# Patient Record
Sex: Male | Born: 1950
Health system: Southern US, Community
[De-identification: ages and names within clinical notes are randomized; demographics above are authoritative.]

## PROBLEM LIST (undated history)

## (undated) DIAGNOSIS — E039 Hypothyroidism, unspecified: Secondary | ICD-10-CM

## (undated) DIAGNOSIS — H269 Unspecified cataract: Secondary | ICD-10-CM

## (undated) DIAGNOSIS — R7303 Prediabetes: Secondary | ICD-10-CM

## (undated) DIAGNOSIS — M1990 Unspecified osteoarthritis, unspecified site: Secondary | ICD-10-CM

## (undated) DIAGNOSIS — E7800 Pure hypercholesterolemia, unspecified: Secondary | ICD-10-CM

## (undated) DIAGNOSIS — R519 Headache, unspecified: Secondary | ICD-10-CM

## (undated) DIAGNOSIS — J189 Pneumonia, unspecified organism: Secondary | ICD-10-CM

## (undated) DIAGNOSIS — I8290 Acute embolism and thrombosis of unspecified vein: Secondary | ICD-10-CM

## (undated) DIAGNOSIS — I8391 Asymptomatic varicose veins of right lower extremity: Secondary | ICD-10-CM

## (undated) DIAGNOSIS — Z972 Presence of dental prosthetic device (complete) (partial): Secondary | ICD-10-CM

## (undated) DIAGNOSIS — I8390 Asymptomatic varicose veins of unspecified lower extremity: Secondary | ICD-10-CM

## (undated) DIAGNOSIS — E559 Vitamin D deficiency, unspecified: Secondary | ICD-10-CM

## (undated) HISTORY — DX: Acute embolism and thrombosis of unspecified vein: I82.90

## (undated) HISTORY — PX: MULTIPLE TOOTH EXTRACTIONS: SHX2053

## (undated) HISTORY — PX: ROTATOR CUFF REPAIR: SHX139

---

## 2017-02-15 DIAGNOSIS — M75122 Complete rotator cuff tear or rupture of left shoulder, not specified as traumatic: Secondary | ICD-10-CM | POA: Diagnosis not present

## 2017-02-15 DIAGNOSIS — M7542 Impingement syndrome of left shoulder: Secondary | ICD-10-CM | POA: Diagnosis not present

## 2017-02-19 DIAGNOSIS — M75111 Incomplete rotator cuff tear or rupture of right shoulder, not specified as traumatic: Secondary | ICD-10-CM | POA: Diagnosis not present

## 2017-02-19 DIAGNOSIS — M7521 Bicipital tendinitis, right shoulder: Secondary | ICD-10-CM | POA: Diagnosis not present

## 2017-02-21 DIAGNOSIS — M7521 Bicipital tendinitis, right shoulder: Secondary | ICD-10-CM | POA: Diagnosis not present

## 2017-02-21 DIAGNOSIS — M7551 Bursitis of right shoulder: Secondary | ICD-10-CM | POA: Diagnosis not present

## 2017-02-21 DIAGNOSIS — Z86718 Personal history of other venous thrombosis and embolism: Secondary | ICD-10-CM | POA: Diagnosis not present

## 2017-02-21 DIAGNOSIS — G8918 Other acute postprocedural pain: Secondary | ICD-10-CM | POA: Diagnosis not present

## 2017-02-21 DIAGNOSIS — M7541 Impingement syndrome of right shoulder: Secondary | ICD-10-CM | POA: Diagnosis not present

## 2017-02-21 DIAGNOSIS — M948X1 Other specified disorders of cartilage, shoulder: Secondary | ICD-10-CM | POA: Diagnosis not present

## 2017-02-21 DIAGNOSIS — M75111 Incomplete rotator cuff tear or rupture of right shoulder, not specified as traumatic: Secondary | ICD-10-CM | POA: Diagnosis not present

## 2017-02-21 DIAGNOSIS — M65811 Other synovitis and tenosynovitis, right shoulder: Secondary | ICD-10-CM | POA: Diagnosis not present

## 2017-02-21 DIAGNOSIS — M75121 Complete rotator cuff tear or rupture of right shoulder, not specified as traumatic: Secondary | ICD-10-CM | POA: Diagnosis not present

## 2017-02-21 DIAGNOSIS — M24011 Loose body in right shoulder: Secondary | ICD-10-CM | POA: Diagnosis not present

## 2017-02-21 DIAGNOSIS — Z87891 Personal history of nicotine dependence: Secondary | ICD-10-CM | POA: Diagnosis not present

## 2017-02-25 DIAGNOSIS — M6281 Muscle weakness (generalized): Secondary | ICD-10-CM | POA: Diagnosis not present

## 2017-02-25 DIAGNOSIS — Z4789 Encounter for other orthopedic aftercare: Secondary | ICD-10-CM | POA: Diagnosis not present

## 2017-02-25 DIAGNOSIS — M25511 Pain in right shoulder: Secondary | ICD-10-CM | POA: Diagnosis not present

## 2017-03-06 DIAGNOSIS — Z4789 Encounter for other orthopedic aftercare: Secondary | ICD-10-CM | POA: Diagnosis not present

## 2017-03-06 DIAGNOSIS — M6281 Muscle weakness (generalized): Secondary | ICD-10-CM | POA: Diagnosis not present

## 2017-03-06 DIAGNOSIS — M25511 Pain in right shoulder: Secondary | ICD-10-CM | POA: Diagnosis not present

## 2017-03-13 DIAGNOSIS — M25511 Pain in right shoulder: Secondary | ICD-10-CM | POA: Diagnosis not present

## 2017-03-13 DIAGNOSIS — Z4789 Encounter for other orthopedic aftercare: Secondary | ICD-10-CM | POA: Diagnosis not present

## 2017-03-13 DIAGNOSIS — M6281 Muscle weakness (generalized): Secondary | ICD-10-CM | POA: Diagnosis not present

## 2017-03-19 DIAGNOSIS — Z4789 Encounter for other orthopedic aftercare: Secondary | ICD-10-CM | POA: Diagnosis not present

## 2017-03-19 DIAGNOSIS — M25511 Pain in right shoulder: Secondary | ICD-10-CM | POA: Diagnosis not present

## 2017-03-19 DIAGNOSIS — M6281 Muscle weakness (generalized): Secondary | ICD-10-CM | POA: Diagnosis not present

## 2017-03-26 DIAGNOSIS — E782 Mixed hyperlipidemia: Secondary | ICD-10-CM | POA: Diagnosis not present

## 2017-03-26 DIAGNOSIS — B179 Acute viral hepatitis, unspecified: Secondary | ICD-10-CM | POA: Diagnosis not present

## 2017-03-26 DIAGNOSIS — E039 Hypothyroidism, unspecified: Secondary | ICD-10-CM | POA: Diagnosis not present

## 2017-03-26 DIAGNOSIS — E663 Overweight: Secondary | ICD-10-CM | POA: Diagnosis not present

## 2017-03-26 DIAGNOSIS — E790 Hyperuricemia without signs of inflammatory arthritis and tophaceous disease: Secondary | ICD-10-CM | POA: Diagnosis not present

## 2017-03-26 DIAGNOSIS — Z130 Encounter for screening for diseases of the blood and blood-forming organs and certain disorders involving the immune mechanism: Secondary | ICD-10-CM | POA: Diagnosis not present

## 2017-03-26 DIAGNOSIS — E559 Vitamin D deficiency, unspecified: Secondary | ICD-10-CM | POA: Diagnosis not present

## 2017-03-26 DIAGNOSIS — E1165 Type 2 diabetes mellitus with hyperglycemia: Secondary | ICD-10-CM | POA: Diagnosis not present

## 2017-03-26 DIAGNOSIS — R1031 Right lower quadrant pain: Secondary | ICD-10-CM | POA: Diagnosis not present

## 2017-03-26 DIAGNOSIS — M79601 Pain in right arm: Secondary | ICD-10-CM | POA: Diagnosis not present

## 2017-03-26 DIAGNOSIS — I10 Essential (primary) hypertension: Secondary | ICD-10-CM | POA: Diagnosis not present

## 2017-03-29 DIAGNOSIS — K5730 Diverticulosis of large intestine without perforation or abscess without bleeding: Secondary | ICD-10-CM | POA: Diagnosis not present

## 2017-03-29 DIAGNOSIS — I700 Atherosclerosis of aorta: Secondary | ICD-10-CM | POA: Diagnosis not present

## 2017-03-29 DIAGNOSIS — K5732 Diverticulitis of large intestine without perforation or abscess without bleeding: Secondary | ICD-10-CM | POA: Diagnosis not present

## 2017-03-29 DIAGNOSIS — R1031 Right lower quadrant pain: Secondary | ICD-10-CM | POA: Diagnosis not present

## 2017-04-03 DIAGNOSIS — M7581 Other shoulder lesions, right shoulder: Secondary | ICD-10-CM | POA: Diagnosis not present

## 2017-04-03 DIAGNOSIS — E6609 Other obesity due to excess calories: Secondary | ICD-10-CM | POA: Diagnosis not present

## 2017-04-03 DIAGNOSIS — K5792 Diverticulitis of intestine, part unspecified, without perforation or abscess without bleeding: Secondary | ICD-10-CM | POA: Diagnosis not present

## 2017-04-03 DIAGNOSIS — M6281 Muscle weakness (generalized): Secondary | ICD-10-CM | POA: Diagnosis not present

## 2017-04-03 DIAGNOSIS — Z9889 Other specified postprocedural states: Secondary | ICD-10-CM | POA: Diagnosis not present

## 2017-04-03 DIAGNOSIS — Z4789 Encounter for other orthopedic aftercare: Secondary | ICD-10-CM | POA: Diagnosis not present

## 2017-04-03 DIAGNOSIS — R52 Pain, unspecified: Secondary | ICD-10-CM | POA: Diagnosis not present

## 2017-04-03 DIAGNOSIS — Z713 Dietary counseling and surveillance: Secondary | ICD-10-CM | POA: Diagnosis not present

## 2017-04-03 DIAGNOSIS — M25511 Pain in right shoulder: Secondary | ICD-10-CM | POA: Diagnosis not present

## 2017-04-15 DIAGNOSIS — M25511 Pain in right shoulder: Secondary | ICD-10-CM | POA: Diagnosis not present

## 2017-04-15 DIAGNOSIS — M6281 Muscle weakness (generalized): Secondary | ICD-10-CM | POA: Diagnosis not present

## 2017-04-15 DIAGNOSIS — Z4789 Encounter for other orthopedic aftercare: Secondary | ICD-10-CM | POA: Diagnosis not present

## 2017-04-25 DIAGNOSIS — E785 Hyperlipidemia, unspecified: Secondary | ICD-10-CM | POA: Diagnosis not present

## 2017-04-25 DIAGNOSIS — F1190 Opioid use, unspecified, uncomplicated: Secondary | ICD-10-CM | POA: Diagnosis not present

## 2017-04-25 DIAGNOSIS — Z5181 Encounter for therapeutic drug level monitoring: Secondary | ICD-10-CM | POA: Diagnosis not present

## 2017-04-25 DIAGNOSIS — M79601 Pain in right arm: Secondary | ICD-10-CM | POA: Diagnosis not present

## 2017-04-25 DIAGNOSIS — G8929 Other chronic pain: Secondary | ICD-10-CM | POA: Diagnosis not present

## 2017-04-30 DIAGNOSIS — Z4789 Encounter for other orthopedic aftercare: Secondary | ICD-10-CM | POA: Diagnosis not present

## 2017-04-30 DIAGNOSIS — M25511 Pain in right shoulder: Secondary | ICD-10-CM | POA: Diagnosis not present

## 2017-04-30 DIAGNOSIS — M6281 Muscle weakness (generalized): Secondary | ICD-10-CM | POA: Diagnosis not present

## 2017-05-02 DIAGNOSIS — M7581 Other shoulder lesions, right shoulder: Secondary | ICD-10-CM | POA: Diagnosis not present

## 2017-05-02 DIAGNOSIS — Z9889 Other specified postprocedural states: Secondary | ICD-10-CM | POA: Diagnosis not present

## 2017-05-14 DIAGNOSIS — M6281 Muscle weakness (generalized): Secondary | ICD-10-CM | POA: Diagnosis not present

## 2017-05-14 DIAGNOSIS — M25511 Pain in right shoulder: Secondary | ICD-10-CM | POA: Diagnosis not present

## 2017-05-14 DIAGNOSIS — Z4789 Encounter for other orthopedic aftercare: Secondary | ICD-10-CM | POA: Diagnosis not present

## 2017-05-23 DIAGNOSIS — E663 Overweight: Secondary | ICD-10-CM | POA: Diagnosis not present

## 2017-05-23 DIAGNOSIS — R0600 Dyspnea, unspecified: Secondary | ICD-10-CM | POA: Diagnosis not present

## 2017-05-23 DIAGNOSIS — G8929 Other chronic pain: Secondary | ICD-10-CM | POA: Diagnosis not present

## 2017-05-23 DIAGNOSIS — F1190 Opioid use, unspecified, uncomplicated: Secondary | ICD-10-CM | POA: Diagnosis not present

## 2017-05-23 DIAGNOSIS — Z5181 Encounter for therapeutic drug level monitoring: Secondary | ICD-10-CM | POA: Diagnosis not present

## 2017-05-28 DIAGNOSIS — Z4789 Encounter for other orthopedic aftercare: Secondary | ICD-10-CM | POA: Diagnosis not present

## 2017-05-28 DIAGNOSIS — M25511 Pain in right shoulder: Secondary | ICD-10-CM | POA: Diagnosis not present

## 2017-05-28 DIAGNOSIS — M6281 Muscle weakness (generalized): Secondary | ICD-10-CM | POA: Diagnosis not present

## 2017-06-12 DIAGNOSIS — M25511 Pain in right shoulder: Secondary | ICD-10-CM | POA: Diagnosis not present

## 2017-06-12 DIAGNOSIS — M6281 Muscle weakness (generalized): Secondary | ICD-10-CM | POA: Diagnosis not present

## 2017-06-12 DIAGNOSIS — Z4789 Encounter for other orthopedic aftercare: Secondary | ICD-10-CM | POA: Diagnosis not present

## 2017-06-13 DIAGNOSIS — S46011D Strain of muscle(s) and tendon(s) of the rotator cuff of right shoulder, subsequent encounter: Secondary | ICD-10-CM | POA: Diagnosis not present

## 2017-06-21 DIAGNOSIS — Z0189 Encounter for other specified special examinations: Secondary | ICD-10-CM | POA: Diagnosis not present

## 2017-06-21 DIAGNOSIS — E6609 Other obesity due to excess calories: Secondary | ICD-10-CM | POA: Diagnosis not present

## 2017-06-21 DIAGNOSIS — Z130 Encounter for screening for diseases of the blood and blood-forming organs and certain disorders involving the immune mechanism: Secondary | ICD-10-CM | POA: Diagnosis not present

## 2017-06-21 DIAGNOSIS — F1190 Opioid use, unspecified, uncomplicated: Secondary | ICD-10-CM | POA: Diagnosis not present

## 2017-06-21 DIAGNOSIS — E782 Mixed hyperlipidemia: Secondary | ICD-10-CM | POA: Diagnosis not present

## 2017-06-21 DIAGNOSIS — Z5181 Encounter for therapeutic drug level monitoring: Secondary | ICD-10-CM | POA: Diagnosis not present

## 2017-06-21 DIAGNOSIS — G8929 Other chronic pain: Secondary | ICD-10-CM | POA: Diagnosis not present

## 2017-07-24 DIAGNOSIS — M5431 Sciatica, right side: Secondary | ICD-10-CM | POA: Diagnosis not present

## 2017-07-24 DIAGNOSIS — G8929 Other chronic pain: Secondary | ICD-10-CM | POA: Diagnosis not present

## 2017-07-24 DIAGNOSIS — F1190 Opioid use, unspecified, uncomplicated: Secondary | ICD-10-CM | POA: Diagnosis not present

## 2017-07-24 DIAGNOSIS — E039 Hypothyroidism, unspecified: Secondary | ICD-10-CM | POA: Diagnosis not present

## 2017-07-24 DIAGNOSIS — Z5181 Encounter for therapeutic drug level monitoring: Secondary | ICD-10-CM | POA: Diagnosis not present

## 2017-07-24 DIAGNOSIS — E559 Vitamin D deficiency, unspecified: Secondary | ICD-10-CM | POA: Diagnosis not present

## 2017-08-21 DIAGNOSIS — M5416 Radiculopathy, lumbar region: Secondary | ICD-10-CM | POA: Diagnosis not present

## 2017-08-23 DIAGNOSIS — K029 Dental caries, unspecified: Secondary | ICD-10-CM | POA: Diagnosis not present

## 2017-08-23 DIAGNOSIS — Z5181 Encounter for therapeutic drug level monitoring: Secondary | ICD-10-CM | POA: Diagnosis not present

## 2017-08-23 DIAGNOSIS — E039 Hypothyroidism, unspecified: Secondary | ICD-10-CM | POA: Diagnosis not present

## 2017-08-23 DIAGNOSIS — M79605 Pain in left leg: Secondary | ICD-10-CM | POA: Diagnosis not present

## 2017-08-23 DIAGNOSIS — I209 Angina pectoris, unspecified: Secondary | ICD-10-CM | POA: Diagnosis not present

## 2017-08-23 DIAGNOSIS — I700 Atherosclerosis of aorta: Secondary | ICD-10-CM | POA: Diagnosis not present

## 2017-08-23 DIAGNOSIS — G8929 Other chronic pain: Secondary | ICD-10-CM | POA: Diagnosis not present

## 2017-08-23 DIAGNOSIS — M79604 Pain in right leg: Secondary | ICD-10-CM | POA: Diagnosis not present

## 2017-08-23 DIAGNOSIS — M7989 Other specified soft tissue disorders: Secondary | ICD-10-CM | POA: Diagnosis not present

## 2017-08-23 DIAGNOSIS — R079 Chest pain, unspecified: Secondary | ICD-10-CM | POA: Diagnosis not present

## 2017-08-23 DIAGNOSIS — F1190 Opioid use, unspecified, uncomplicated: Secondary | ICD-10-CM | POA: Diagnosis not present

## 2017-08-23 DIAGNOSIS — R0789 Other chest pain: Secondary | ICD-10-CM | POA: Diagnosis not present

## 2017-08-26 DIAGNOSIS — R001 Bradycardia, unspecified: Secondary | ICD-10-CM | POA: Diagnosis not present

## 2017-08-28 DIAGNOSIS — M79604 Pain in right leg: Secondary | ICD-10-CM | POA: Diagnosis not present

## 2017-08-28 DIAGNOSIS — M79605 Pain in left leg: Secondary | ICD-10-CM | POA: Diagnosis not present

## 2017-09-20 DIAGNOSIS — E782 Mixed hyperlipidemia: Secondary | ICD-10-CM | POA: Diagnosis not present

## 2017-09-20 DIAGNOSIS — E039 Hypothyroidism, unspecified: Secondary | ICD-10-CM | POA: Diagnosis not present

## 2017-09-20 DIAGNOSIS — Z5181 Encounter for therapeutic drug level monitoring: Secondary | ICD-10-CM | POA: Diagnosis not present

## 2017-09-20 DIAGNOSIS — E6609 Other obesity due to excess calories: Secondary | ICD-10-CM | POA: Diagnosis not present

## 2017-09-20 DIAGNOSIS — E559 Vitamin D deficiency, unspecified: Secondary | ICD-10-CM | POA: Diagnosis not present

## 2017-09-20 DIAGNOSIS — G8929 Other chronic pain: Secondary | ICD-10-CM | POA: Diagnosis not present

## 2017-09-20 DIAGNOSIS — F1190 Opioid use, unspecified, uncomplicated: Secondary | ICD-10-CM | POA: Diagnosis not present

## 2017-09-21 DIAGNOSIS — R509 Fever, unspecified: Secondary | ICD-10-CM | POA: Diagnosis not present

## 2017-09-21 DIAGNOSIS — R5383 Other fatigue: Secondary | ICD-10-CM | POA: Diagnosis not present

## 2017-09-22 DIAGNOSIS — R509 Fever, unspecified: Secondary | ICD-10-CM | POA: Diagnosis not present

## 2017-10-21 DIAGNOSIS — F1190 Opioid use, unspecified, uncomplicated: Secondary | ICD-10-CM | POA: Diagnosis not present

## 2017-10-21 DIAGNOSIS — A770 Spotted fever due to Rickettsia rickettsii: Secondary | ICD-10-CM | POA: Diagnosis not present

## 2017-10-21 DIAGNOSIS — E6609 Other obesity due to excess calories: Secondary | ICD-10-CM | POA: Diagnosis not present

## 2017-10-21 DIAGNOSIS — G8929 Other chronic pain: Secondary | ICD-10-CM | POA: Diagnosis not present

## 2017-10-21 DIAGNOSIS — Z5181 Encounter for therapeutic drug level monitoring: Secondary | ICD-10-CM | POA: Diagnosis not present

## 2017-11-22 DIAGNOSIS — F1190 Opioid use, unspecified, uncomplicated: Secondary | ICD-10-CM | POA: Diagnosis not present

## 2017-11-22 DIAGNOSIS — Z5181 Encounter for therapeutic drug level monitoring: Secondary | ICD-10-CM | POA: Diagnosis not present

## 2017-11-22 DIAGNOSIS — A770 Spotted fever due to Rickettsia rickettsii: Secondary | ICD-10-CM | POA: Diagnosis not present

## 2017-11-22 DIAGNOSIS — G8929 Other chronic pain: Secondary | ICD-10-CM | POA: Diagnosis not present

## 2017-11-22 DIAGNOSIS — W57XXXA Bitten or stung by nonvenomous insect and other nonvenomous arthropods, initial encounter: Secondary | ICD-10-CM | POA: Diagnosis not present

## 2017-11-22 DIAGNOSIS — A6920 Lyme disease, unspecified: Secondary | ICD-10-CM | POA: Diagnosis not present

## 2017-12-20 DIAGNOSIS — E782 Mixed hyperlipidemia: Secondary | ICD-10-CM | POA: Diagnosis not present

## 2017-12-20 DIAGNOSIS — R609 Edema, unspecified: Secondary | ICD-10-CM | POA: Diagnosis not present

## 2017-12-20 DIAGNOSIS — I83811 Varicose veins of right lower extremities with pain: Secondary | ICD-10-CM | POA: Diagnosis not present

## 2017-12-20 DIAGNOSIS — G4700 Insomnia, unspecified: Secondary | ICD-10-CM | POA: Diagnosis not present

## 2017-12-20 DIAGNOSIS — R52 Pain, unspecified: Secondary | ICD-10-CM | POA: Diagnosis not present

## 2017-12-20 DIAGNOSIS — E663 Overweight: Secondary | ICD-10-CM | POA: Diagnosis not present

## 2017-12-20 DIAGNOSIS — A770 Spotted fever due to Rickettsia rickettsii: Secondary | ICD-10-CM | POA: Diagnosis not present

## 2017-12-20 DIAGNOSIS — M79604 Pain in right leg: Secondary | ICD-10-CM | POA: Diagnosis not present

## 2017-12-20 DIAGNOSIS — F1190 Opioid use, unspecified, uncomplicated: Secondary | ICD-10-CM | POA: Diagnosis not present

## 2017-12-20 DIAGNOSIS — E039 Hypothyroidism, unspecified: Secondary | ICD-10-CM | POA: Diagnosis not present

## 2017-12-20 DIAGNOSIS — G8929 Other chronic pain: Secondary | ICD-10-CM | POA: Diagnosis not present

## 2017-12-20 DIAGNOSIS — Z5181 Encounter for therapeutic drug level monitoring: Secondary | ICD-10-CM | POA: Diagnosis not present

## 2017-12-20 DIAGNOSIS — E559 Vitamin D deficiency, unspecified: Secondary | ICD-10-CM | POA: Diagnosis not present

## 2017-12-20 DIAGNOSIS — I83812 Varicose veins of left lower extremities with pain: Secondary | ICD-10-CM | POA: Diagnosis not present

## 2017-12-20 DIAGNOSIS — Z0189 Encounter for other specified special examinations: Secondary | ICD-10-CM | POA: Diagnosis not present

## 2017-12-20 DIAGNOSIS — M79605 Pain in left leg: Secondary | ICD-10-CM | POA: Diagnosis not present

## 2017-12-25 ENCOUNTER — Other Ambulatory Visit: Payer: Self-pay

## 2017-12-25 DIAGNOSIS — I83813 Varicose veins of bilateral lower extremities with pain: Secondary | ICD-10-CM

## 2017-12-27 DIAGNOSIS — R789 Finding of unspecified substance, not normally found in blood: Secondary | ICD-10-CM | POA: Diagnosis not present

## 2017-12-27 DIAGNOSIS — R899 Unspecified abnormal finding in specimens from other organs, systems and tissues: Secondary | ICD-10-CM | POA: Diagnosis not present

## 2018-01-07 ENCOUNTER — Emergency Department (HOSPITAL_BASED_OUTPATIENT_CLINIC_OR_DEPARTMENT_OTHER): Payer: PPO

## 2018-01-07 ENCOUNTER — Emergency Department (HOSPITAL_COMMUNITY)
Admission: EM | Admit: 2018-01-07 | Discharge: 2018-01-07 | Disposition: A | Discharge status: Home / Self Care | Payer: PPO | Attending: Emergency Medicine | Admitting: Emergency Medicine

## 2018-01-07 ENCOUNTER — Encounter (HOSPITAL_COMMUNITY): Payer: Self-pay | Admitting: Emergency Medicine

## 2018-01-07 ENCOUNTER — Other Ambulatory Visit: Payer: Self-pay

## 2018-01-07 DIAGNOSIS — R609 Edema, unspecified: Secondary | ICD-10-CM | POA: Diagnosis present

## 2018-01-07 DIAGNOSIS — I83893 Varicose veins of bilateral lower extremities with other complications: Secondary | ICD-10-CM

## 2018-01-07 DIAGNOSIS — Z87891 Personal history of nicotine dependence: Secondary | ICD-10-CM | POA: Insufficient documentation

## 2018-01-07 DIAGNOSIS — M7989 Other specified soft tissue disorders: Secondary | ICD-10-CM | POA: Diagnosis not present

## 2018-01-07 DIAGNOSIS — R52 Pain, unspecified: Secondary | ICD-10-CM

## 2018-01-07 HISTORY — DX: Asymptomatic varicose veins of unspecified lower extremity: I83.90

## 2018-01-07 NOTE — ED Notes (Signed)
ED Provider at bedside. 

## 2018-01-07 NOTE — ED Notes (Signed)
Pt returned from vascular, nad noted

## 2018-01-07 NOTE — ED Provider Notes (Signed)
[facility] EMERGENCY DEPARTMENT Provider Note   CSN: [id number] Arrival date & time: 01/07/18  0846   History   Chief Complaint Chief Complaint  Patient presents with  . Leg Swelling    HPI Juan Young is a 67 y.o. male who presents with bilateral leg edema. PMH significant for varicose veins and for what sounds like a remote hx of thrombectomy, RMSF. He states that he has chronic lower leg edema but it has been worse over the past month and he has been having pain in his legs bilaterally. He also complains of a "hardness" to his lower legs as well as some mild redness. The swelling goes down when he elevates the legs. It worsens when he is on his feet all day. He denies chest pain or SOB. He just recently went to his PCP to have blood work done. He is worried that he has another blood clot. He has an appointment with vascular in Dec but felt he couldn't wait that long. The secretary there told him to come to the ED for a vascular consult.   HPI  Past Medical History:  Diagnosis Date  . Varicose vein of leg     There are no active problems to display for this patient.   Past Surgical History:  Procedure Laterality Date  . ROTATOR CUFF REPAIR Bilateral         Home Medications    Prior to Admission medications   Not on File    Family History No family history on file.  Social History Social History   Tobacco Use  . Smoking status: Former Games developer  . Smokeless tobacco: Never Used  . Tobacco comment: 16 YEARS AGO  Substance Use Topics  . Alcohol use: Not Currently  . Drug use: Never     Allergies   Patient has no known allergies.   Review of Systems Review of Systems  Respiratory: Negative for shortness of breath.   Cardiovascular: Positive for leg swelling. Negative for chest pain.  Gastrointestinal: Negative for abdominal pain.  All other systems reviewed and are negative.    Physical Exam Updated Vital Signs BP (!) 153/75  (BP Location: Right Arm)   Pulse (!) 59   Temp 97.9 F (36.6 C) (Oral)   Resp 16   Ht 5\' 9"  (1.753 m)   Wt 98 kg   SpO2 95%   BMI 31.90 kg/m   Physical Exam  Constitutional: He is oriented to person, place, and time. He appears well-developed and well-nourished. No distress.  HENT:  Head: Normocephalic and atraumatic.  Eyes: Pupils are equal, round, and reactive to light. Conjunctivae are normal. Right eye exhibits no discharge. Left eye exhibits no discharge. No scleral icterus.  Neck: Normal range of motion.  Cardiovascular: Normal rate and regular rhythm.  Pulmonary/Chest: Effort normal and breath sounds normal. No respiratory distress.  Abdominal: He exhibits no distension.  Musculoskeletal:  Bilateral non-pitting edema with chronic skin changes consistent with venous stasis. Multiple varicose veins. 2+ BP pulse bilaterally.  Neurological: He is alert and oriented to person, place, and time.  Skin: Skin is warm and dry.  Psychiatric: He has a normal mood and affect. His behavior is normal.  Nursing note and vitals reviewed.    ED Treatments / Results  Labs (all labs ordered are listed, but only abnormal results are displayed) Labs Reviewed - No data to display  EKG None  Radiology No results found.  Procedures Procedures (including critical care time)  Medications Ordered in ED Medications - No data to display   Initial Impression / Assessment and Plan / ED Course  I have reviewed the triage vital signs and the nursing notes.  Pertinent labs & imaging results that were available during my care of the patient were reviewed by me and considered in my medical decision making (see chart for details).  67 year old male presents with worsening of his chronic lower leg edema. He is mildly hypertensive but otherwise vitals are normal. Exam is consistent with chronic venous statis skin changes. For reassurance, doppler studies were ordered which were negative. I  discussed with his PCP on the phone who did recent blood work and she stated his kidney and liver function is normal. Results from the negative DVT study were sent to her. He was advised to elevate the legs and wear compression stockings during the day. He verbalized understanding.  Final Clinical Impressions(s) / ED Diagnoses   Final diagnoses:  Varicose veins of leg with swelling, bilateral    ED Discharge Orders    None       Bethel Born, PA-C 01/07/18 1358    Pricilla Loveless, MD 01/08/18 [phone]

## 2018-01-07 NOTE — Discharge Instructions (Signed)
Please elevate the legs and use compression stockings to help with swelling Follow up with your doctor

## 2018-01-07 NOTE — ED Triage Notes (Signed)
Pt presents with increased bilateral leg swelling and redness in the past month. He reports hx of varicose veins and has sharp pain like a needle prick in the legs and feet. Denies Chest Pain. The patient is alert and oriented x4 with no acute distress at triage.

## 2018-01-07 NOTE — Progress Notes (Signed)
*  Preliminary Results* Bilateral lower extremity venous duplex completed. Bilateral lower extremities are negative for deep vein thrombosis. There is no evidence of Baker's cyst bilaterally.  01/07/2018 11:17 AM Gertie Fey, MHA, RVT, RDCS, RDMS

## 2018-01-21 DIAGNOSIS — M25541 Pain in joints of right hand: Secondary | ICD-10-CM | POA: Diagnosis not present

## 2018-01-21 DIAGNOSIS — F1190 Opioid use, unspecified, uncomplicated: Secondary | ICD-10-CM | POA: Diagnosis not present

## 2018-01-21 DIAGNOSIS — M25542 Pain in joints of left hand: Secondary | ICD-10-CM | POA: Diagnosis not present

## 2018-01-21 DIAGNOSIS — Z5181 Encounter for therapeutic drug level monitoring: Secondary | ICD-10-CM | POA: Diagnosis not present

## 2018-01-21 DIAGNOSIS — G8929 Other chronic pain: Secondary | ICD-10-CM | POA: Diagnosis not present

## 2018-01-21 DIAGNOSIS — E039 Hypothyroidism, unspecified: Secondary | ICD-10-CM | POA: Diagnosis not present

## 2018-02-13 DIAGNOSIS — Z23 Encounter for immunization: Secondary | ICD-10-CM | POA: Diagnosis not present

## 2018-02-28 DIAGNOSIS — F1190 Opioid use, unspecified, uncomplicated: Secondary | ICD-10-CM | POA: Diagnosis not present

## 2018-02-28 DIAGNOSIS — Z5181 Encounter for therapeutic drug level monitoring: Secondary | ICD-10-CM | POA: Diagnosis not present

## 2018-02-28 DIAGNOSIS — G8929 Other chronic pain: Secondary | ICD-10-CM | POA: Diagnosis not present

## 2018-02-28 DIAGNOSIS — E663 Overweight: Secondary | ICD-10-CM | POA: Diagnosis not present

## 2018-02-28 DIAGNOSIS — M722 Plantar fascial fibromatosis: Secondary | ICD-10-CM | POA: Diagnosis not present

## 2018-03-04 ENCOUNTER — Ambulatory Visit (INDEPENDENT_AMBULATORY_CARE_PROVIDER_SITE_OTHER): Payer: PPO | Admitting: Vascular Surgery

## 2018-03-04 ENCOUNTER — Ambulatory Visit (HOSPITAL_COMMUNITY)
Admission: RE | Admit: 2018-03-04 | Discharge: 2018-03-04 | Disposition: A | Discharge status: Home / Self Care | Payer: PPO | Source: Ambulatory Visit | Attending: Vascular Surgery | Admitting: Vascular Surgery

## 2018-03-04 ENCOUNTER — Encounter: Payer: Self-pay | Admitting: Vascular Surgery

## 2018-03-04 ENCOUNTER — Encounter

## 2018-03-04 VITALS — BP 114/65 | HR 68 | Temp 97.0°F | Resp 14 | Ht 69.0 in | Wt 223.0 lb

## 2018-03-04 DIAGNOSIS — I83813 Varicose veins of bilateral lower extremities with pain: Secondary | ICD-10-CM | POA: Insufficient documentation

## 2018-03-04 NOTE — Progress Notes (Signed)
Vascular and Vein Specialist of   Patient name: Juan MingleSamuel K Young MRN: [id number] DOB: 09-19-1950 Sex: male  REASON FOR CONSULT: Evaluation venous hypertension and varicosities bilateral lower extremities  HPI: Juan MingleSamuel K Young is a 67 y.o. male, who is here today for evaluation of lower extremity venous hypertension and pain.  He is a very pleasant gentleman who has a past history in his 30s of right great saphenous vein stripping from his knee to his saphenofemoral junction.  Also has what sounds like stab phlebectomy of multiple tributary varicosities.  He reports that over the past several months he has had progressively severe swelling in both lower extremities and also is noticing darkening of his distal medial calves above his ankle.  He does report that there was a blood clot removed at the time of his surgery but I suspect that this may have simply been superficial thrombophlebitis and not DVT.  He specifically remembers that he was not on any anticoagulation at that time.  He has no history of cardiac disease.  Does report improvement with elevation.  He has been fitted with 20 to 30 mm graduated compression garments and has been wearing these for 1 month.  He does report some benefit but continues to have swelling and discomfort with prolonged standing.  Past Medical History:  Diagnosis Date  . Blood clot in vein    67 years old  . Varicose vein of leg     Family History  Problem Relation Age of Onset  . Cancer Mother   . Cancer Father     SOCIAL HISTORY: Social History   Socioeconomic History  . Marital status: Widowed    Spouse name: Not on file  . Number of children: Not on file  . Years of education: Not on file  . Highest education level: Not on file  Occupational History  . Not on file  Social Needs  . Financial resource strain: Not on file  . Food insecurity:    Worry: Not on file    Inability: Not on file  .  Transportation needs:    Medical: Not on file    Non-medical: Not on file  Tobacco Use  . Smoking status: Former Games developermoker  . Smokeless tobacco: Never Used  . Tobacco comment: 16 YEARS AGO  Substance and Sexual Activity  . Alcohol use: Not Currently  . Drug use: Never  . Sexual activity: Not on file  Lifestyle  . Physical activity:    Days per week: Not on file    Minutes per session: Not on file  . Stress: Not on file  Relationships  . Social connections:    Talks on phone: Not on file    Gets together: Not on file    Attends religious service: Not on file    Active member of club or organization: Not on file    Attends meetings of clubs or organizations: Not on file    Relationship status: Not on file  . Intimate partner violence:    Fear of current or ex partner: Not on file    Emotionally abused: Not on file    Physically abused: Not on file    Forced sexual activity: Not on file  Other Topics Concern  . Not on file  Social History Narrative  . Not on file    No Known Allergies  Current Outpatient Medications  Medication Sig Dispense Refill  . oxyCODONE (OXY IR/ROXICODONE) 5 MG immediate release tablet every 4 (  four) hours as needed.      No current facility-administered medications for this visit.     REVIEW OF SYSTEMS:  [X]  denotes positive finding, [ ]  denotes negative finding Cardiac  Comments:  Chest pain or chest pressure:    Shortness of breath upon exertion: x   Short of breath when lying flat:    Irregular heart rhythm:        Vascular    Pain in calf, thigh, or hip brought on by ambulation: x   Pain in feet at night that wakes you up from your sleep:  x   Blood clot in your veins:    Leg swelling:  x       Pulmonary    Oxygen at home:    Productive cough:     Wheezing:         Neurologic    Sudden weakness in arms or legs:  x   Sudden numbness in arms or legs:  x   Sudden onset of difficulty speaking or slurred speech:    Temporary loss of  vision in one eye:     Problems with dizziness:         Gastrointestinal    Blood in stool:     Vomited blood:         Genitourinary    Burning when urinating:     Blood in urine:        Psychiatric    Major depression:         Hematologic    Bleeding problems:    Problems with blood clotting too easily:        Skin    Rashes or ulcers:        Constitutional    Fever or chills:      PHYSICAL EXAM: Vitals:   03/04/18 0931  BP: 114/65  Pulse: 68  Resp: 14  Temp: (!) 97 F (36.1 C)  SpO2: 94%  Weight: 223 lb (101.2 kg)  Height: 5\' 9"  (1.753 m)    GENERAL: The patient is a well-nourished male, in no acute distress. The vital signs are documented above. CARDIOVASCULAR: 2+ radial and 2+ dorsalis pedis pulses bilaterally.  Varicosities noted in his right posterior calf. PULMONARY: There is good air exchange  ABDOMEN: Soft and non-tender  MUSCULOSKELETAL: There are no major deformities or cyanosis. NEUROLOGIC: No focal weakness or paresthesias are detected. SKIN: There are no ulcers or rashes noted.  Does have thickening and hemosiderin deposition in both lower extremities above his medial ankle matted telangiectasia over both ankles and feet pSYCHIATRIC: The patient has a normal affect.  DATA:  Duplex today shows no evidence of DVT.  He does have surgical absence of his right great saphenous vein.  On the left he has reflux throughout his great saphenous vein with dilatation ranging from 5 mm to 7 mm throughout its course.  MEDICAL ISSUES: Long discussion with patient regarding his venous pathology.  He has been encouraged to continue wearing his graduated compression garments and elevation when possible.  I do feel that he would benefit from laser ablation of his left great saphenous vein.  He does have some deep venous reflux and therefore I explained that he could not have complete correction of his venous hypertension but this certainly would be improved with left leg  ablation.  We will see him back in our office in several months to determine the benefit he is achieving from his 20 to 30 mmHg graduated  compression garments.  Explained the Dr. Darrick Pennafields or Edilia BoDickson will see him to discuss potential ablation   Larina Earthlyodd F. Arius Harnois, MD [facility] Office Tel [phone] Pager [phone]

## 2018-03-31 DIAGNOSIS — G4700 Insomnia, unspecified: Secondary | ICD-10-CM | POA: Diagnosis not present

## 2018-03-31 DIAGNOSIS — F1190 Opioid use, unspecified, uncomplicated: Secondary | ICD-10-CM | POA: Diagnosis not present

## 2018-03-31 DIAGNOSIS — E782 Mixed hyperlipidemia: Secondary | ICD-10-CM | POA: Diagnosis not present

## 2018-03-31 DIAGNOSIS — Z5181 Encounter for therapeutic drug level monitoring: Secondary | ICD-10-CM | POA: Diagnosis not present

## 2018-03-31 DIAGNOSIS — M545 Low back pain: Secondary | ICD-10-CM | POA: Diagnosis not present

## 2018-03-31 DIAGNOSIS — G8929 Other chronic pain: Secondary | ICD-10-CM | POA: Diagnosis not present

## 2018-03-31 DIAGNOSIS — E039 Hypothyroidism, unspecified: Secondary | ICD-10-CM | POA: Diagnosis not present

## 2018-04-16 ENCOUNTER — Ambulatory Visit: Payer: PPO | Admitting: Vascular Surgery

## 2018-04-18 ENCOUNTER — Ambulatory Visit (INDEPENDENT_AMBULATORY_CARE_PROVIDER_SITE_OTHER): Payer: PPO | Admitting: Vascular Surgery

## 2018-04-18 ENCOUNTER — Encounter: Payer: Self-pay | Admitting: Vascular Surgery

## 2018-04-18 ENCOUNTER — Other Ambulatory Visit: Payer: Self-pay

## 2018-04-18 VITALS — BP 126/81 | HR 67 | Resp 18 | Ht 69.0 in | Wt 223.0 lb

## 2018-04-18 DIAGNOSIS — I83813 Varicose veins of bilateral lower extremities with pain: Secondary | ICD-10-CM

## 2018-04-18 NOTE — Progress Notes (Signed)
Patient is a 68 year old male who returns for follow-up today.  He was last seen by my partner Dr. Arbie Cookey December 2019.  His primary complaint is of varicose veins in both legs.  He develops fullness heaviness and achiness with some swelling in both lower extremities as the day progresses.  Usually his legs are improved with elevation and by the next morning.  He has now been compliant wearing compression stockings since they were prescribed for him in the emergency room January 07, 2018.  He states the stockings do improve his symptoms but do not completely alleviate the pain.  He denies prior DVT.  He apparently had some type of vein stripping procedure done in the right leg in the past.  This was evidenced by his previous duplex ultrasound which showed absent right greater saphenous in the mid thigh on the right side.  Past Medical History:  Diagnosis Date  . Blood clot in vein    68 years old  . Varicose vein of leg     Past Surgical History:  Procedure Laterality Date  . ROTATOR CUFF REPAIR Bilateral    Current Outpatient Medications on File Prior to Visit  Medication Sig Dispense Refill  . oxyCODONE (OXY IR/ROXICODONE) 5 MG immediate release tablet every 4 (four) hours as needed.      No current facility-administered medications on file prior to visit.     No Known Allergies  Social History   Socioeconomic History  . Marital status: Widowed    Spouse name: Not on file  . Number of children: Not on file  . Years of education: Not on file  . Highest education level: Not on file  Occupational History  . Not on file  Social Needs  . Financial resource strain: Not on file  . Food insecurity:    Worry: Not on file    Inability: Not on file  . Transportation needs:    Medical: Not on file    Non-medical: Not on file  Tobacco Use  . Smoking status: Former Games developer  . Smokeless tobacco: Never Used  . Tobacco comment: 16 YEARS AGO  Substance and Sexual Activity  . Alcohol use:  Not Currently  . Drug use: Never  . Sexual activity: Not on file  Lifestyle  . Physical activity:    Days per week: Not on file    Minutes per session: Not on file  . Stress: Not on file  Relationships  . Social connections:    Talks on phone: Not on file    Gets together: Not on file    Attends religious service: Not on file    Active member of club or organization: Not on file    Attends meetings of clubs or organizations: Not on file    Relationship status: Not on file  . Intimate partner violence:    Fear of current or ex partner: Not on file    Emotionally abused: Not on file    Physically abused: Not on file    Forced sexual activity: Not on file  Other Topics Concern  . Not on file  Social History Narrative  . Not on file     Review of systems: He denies shortness of breath.  He denies chest pain.  Physical exam:  Vitals:   04/18/18 1445  BP: 126/81  Pulse: 67  Resp: 18  SpO2: 94%  Weight: 223 lb (101.2 kg)  Height: 5\' 9"  (1.753 m)   Extremities: 2+ dorsalis pedis pulses and  posterior tibial pulses bilaterally, trace pretibial edema  Skin: Hemosiderin staining with warmth especially over the left medial malleolus, ecchymosis left fifth toe from trauma where he kicked a piece of furniture by accident last evening, audible spider and reticular type varicosities diffusely throughout both legs especially around the ankle and foot bilaterally      Data: I reviewed the patient's venous duplex ultrasound March 04, 2018.  This showed reflux in the deep and superficial system bilaterally.  The right greater saphenous vein was absent from the saphenofemoral junction to the mid thigh.  The lesser saphenous vein on the right side is 6 to 9 mm diameter with multiple varicosities originating from it.  The left greater saphenous has diffuse reflux with a 4 to 7 mm diameter.  I repeated portions of the patient's ultrasound at the bedside today with the SonoSite.  This  confirmed a 4 mm greater saphenous vein in the left leg.  Assessment: Symptomatic varicose veins with pain and swelling.  Symptoms not completely alleviated with compression therapy after 3 months.  Plan: I believe the patient would benefit from laser ablation of his left greater saphenous vein as well as laser ablation of his right lesser saphenous vein for symptomatic relief.  Risk benefits possible complications of procedure details were discussed with the patient today.  He will continue to wear his compression stockings.  We will see if we can get him approved for laser ablation.  Fabienne Bruns, MD [facility] Vein Specialists of [place] Office: [phone] Pager: [phone]

## 2018-04-24 ENCOUNTER — Other Ambulatory Visit: Payer: Self-pay | Admitting: *Deleted

## 2018-04-24 DIAGNOSIS — I83813 Varicose veins of bilateral lower extremities with pain: Secondary | ICD-10-CM

## 2018-05-01 DIAGNOSIS — Z130 Encounter for screening for diseases of the blood and blood-forming organs and certain disorders involving the immune mechanism: Secondary | ICD-10-CM | POA: Diagnosis not present

## 2018-05-01 DIAGNOSIS — M545 Low back pain: Secondary | ICD-10-CM | POA: Diagnosis not present

## 2018-05-01 DIAGNOSIS — M25522 Pain in left elbow: Secondary | ICD-10-CM | POA: Diagnosis not present

## 2018-05-01 DIAGNOSIS — E559 Vitamin D deficiency, unspecified: Secondary | ICD-10-CM | POA: Diagnosis not present

## 2018-05-01 DIAGNOSIS — E782 Mixed hyperlipidemia: Secondary | ICD-10-CM | POA: Diagnosis not present

## 2018-05-01 DIAGNOSIS — R7309 Other abnormal glucose: Secondary | ICD-10-CM | POA: Diagnosis not present

## 2018-05-01 DIAGNOSIS — M25531 Pain in right wrist: Secondary | ICD-10-CM | POA: Diagnosis not present

## 2018-05-01 DIAGNOSIS — G8929 Other chronic pain: Secondary | ICD-10-CM | POA: Diagnosis not present

## 2018-05-01 DIAGNOSIS — E663 Overweight: Secondary | ICD-10-CM | POA: Diagnosis not present

## 2018-05-01 DIAGNOSIS — R5383 Other fatigue: Secondary | ICD-10-CM | POA: Diagnosis not present

## 2018-05-01 DIAGNOSIS — Z5181 Encounter for therapeutic drug level monitoring: Secondary | ICD-10-CM | POA: Diagnosis not present

## 2018-05-01 DIAGNOSIS — E039 Hypothyroidism, unspecified: Secondary | ICD-10-CM | POA: Diagnosis not present

## 2018-05-01 DIAGNOSIS — R0602 Shortness of breath: Secondary | ICD-10-CM | POA: Diagnosis not present

## 2018-05-01 DIAGNOSIS — R52 Pain, unspecified: Secondary | ICD-10-CM | POA: Diagnosis not present

## 2018-05-01 DIAGNOSIS — F1190 Opioid use, unspecified, uncomplicated: Secondary | ICD-10-CM | POA: Diagnosis not present

## 2018-05-19 DIAGNOSIS — E039 Hypothyroidism, unspecified: Secondary | ICD-10-CM | POA: Diagnosis not present

## 2018-05-19 DIAGNOSIS — G4700 Insomnia, unspecified: Secondary | ICD-10-CM | POA: Diagnosis not present

## 2018-05-19 DIAGNOSIS — E782 Mixed hyperlipidemia: Secondary | ICD-10-CM | POA: Diagnosis not present

## 2018-05-19 DIAGNOSIS — M79642 Pain in left hand: Secondary | ICD-10-CM | POA: Diagnosis not present

## 2018-05-19 DIAGNOSIS — M79641 Pain in right hand: Secondary | ICD-10-CM | POA: Diagnosis not present

## 2018-05-19 DIAGNOSIS — M545 Low back pain: Secondary | ICD-10-CM | POA: Diagnosis not present

## 2018-05-30 DIAGNOSIS — Z5181 Encounter for therapeutic drug level monitoring: Secondary | ICD-10-CM | POA: Diagnosis not present

## 2018-05-30 DIAGNOSIS — M79641 Pain in right hand: Secondary | ICD-10-CM | POA: Diagnosis not present

## 2018-05-30 DIAGNOSIS — M79642 Pain in left hand: Secondary | ICD-10-CM | POA: Diagnosis not present

## 2018-05-30 DIAGNOSIS — E782 Mixed hyperlipidemia: Secondary | ICD-10-CM | POA: Diagnosis not present

## 2018-05-30 DIAGNOSIS — E039 Hypothyroidism, unspecified: Secondary | ICD-10-CM | POA: Diagnosis not present

## 2018-05-30 DIAGNOSIS — G4700 Insomnia, unspecified: Secondary | ICD-10-CM | POA: Diagnosis not present

## 2018-05-30 DIAGNOSIS — G8929 Other chronic pain: Secondary | ICD-10-CM | POA: Diagnosis not present

## 2018-05-30 DIAGNOSIS — M545 Low back pain: Secondary | ICD-10-CM | POA: Diagnosis not present

## 2018-05-30 DIAGNOSIS — F1190 Opioid use, unspecified, uncomplicated: Secondary | ICD-10-CM | POA: Diagnosis not present

## 2018-06-02 DIAGNOSIS — M25531 Pain in right wrist: Secondary | ICD-10-CM | POA: Diagnosis not present

## 2018-06-02 DIAGNOSIS — G5622 Lesion of ulnar nerve, left upper limb: Secondary | ICD-10-CM | POA: Diagnosis not present

## 2018-06-02 DIAGNOSIS — G5602 Carpal tunnel syndrome, left upper limb: Secondary | ICD-10-CM | POA: Diagnosis not present

## 2018-06-03 DIAGNOSIS — G5602 Carpal tunnel syndrome, left upper limb: Secondary | ICD-10-CM | POA: Diagnosis not present

## 2018-06-16 DIAGNOSIS — M19041 Primary osteoarthritis, right hand: Secondary | ICD-10-CM | POA: Diagnosis not present

## 2018-06-16 DIAGNOSIS — M25531 Pain in right wrist: Secondary | ICD-10-CM | POA: Diagnosis not present

## 2018-06-18 ENCOUNTER — Other Ambulatory Visit: Payer: PPO | Admitting: Vascular Surgery

## 2018-06-18 DIAGNOSIS — Z7401 Bed confinement status: Secondary | ICD-10-CM | POA: Diagnosis not present

## 2018-06-18 DIAGNOSIS — M2550 Pain in unspecified joint: Secondary | ICD-10-CM | POA: Diagnosis not present

## 2018-06-18 DIAGNOSIS — I10 Essential (primary) hypertension: Secondary | ICD-10-CM | POA: Diagnosis not present

## 2018-06-18 DIAGNOSIS — G4733 Obstructive sleep apnea (adult) (pediatric): Secondary | ICD-10-CM | POA: Diagnosis not present

## 2018-06-18 DIAGNOSIS — R4182 Altered mental status, unspecified: Secondary | ICD-10-CM | POA: Diagnosis not present

## 2018-06-18 DIAGNOSIS — M19041 Primary osteoarthritis, right hand: Secondary | ICD-10-CM | POA: Diagnosis not present

## 2018-06-19 DIAGNOSIS — H2513 Age-related nuclear cataract, bilateral: Secondary | ICD-10-CM | POA: Diagnosis not present

## 2018-06-19 DIAGNOSIS — E113293 Type 2 diabetes mellitus with mild nonproliferative diabetic retinopathy without macular edema, bilateral: Secondary | ICD-10-CM | POA: Diagnosis not present

## 2018-06-19 DIAGNOSIS — H25049 Posterior subcapsular polar age-related cataract, unspecified eye: Secondary | ICD-10-CM | POA: Diagnosis not present

## 2018-06-19 DIAGNOSIS — G5602 Carpal tunnel syndrome, left upper limb: Secondary | ICD-10-CM | POA: Diagnosis not present

## 2018-06-19 DIAGNOSIS — M79602 Pain in left arm: Secondary | ICD-10-CM | POA: Diagnosis not present

## 2018-06-19 DIAGNOSIS — M79642 Pain in left hand: Secondary | ICD-10-CM | POA: Diagnosis not present

## 2018-06-19 DIAGNOSIS — H35033 Hypertensive retinopathy, bilateral: Secondary | ICD-10-CM | POA: Diagnosis not present

## 2018-06-19 DIAGNOSIS — G5622 Lesion of ulnar nerve, left upper limb: Secondary | ICD-10-CM | POA: Diagnosis not present

## 2018-06-25 ENCOUNTER — Ambulatory Visit: Payer: PPO | Admitting: Vascular Surgery

## 2018-06-25 ENCOUNTER — Encounter (HOSPITAL_COMMUNITY): Payer: PPO

## 2018-07-01 DIAGNOSIS — E039 Hypothyroidism, unspecified: Secondary | ICD-10-CM | POA: Diagnosis not present

## 2018-07-01 DIAGNOSIS — G8929 Other chronic pain: Secondary | ICD-10-CM | POA: Diagnosis not present

## 2018-07-01 DIAGNOSIS — Z5181 Encounter for therapeutic drug level monitoring: Secondary | ICD-10-CM | POA: Diagnosis not present

## 2018-07-01 DIAGNOSIS — M79641 Pain in right hand: Secondary | ICD-10-CM | POA: Diagnosis not present

## 2018-07-01 DIAGNOSIS — E669 Obesity, unspecified: Secondary | ICD-10-CM | POA: Diagnosis not present

## 2018-07-01 DIAGNOSIS — F1190 Opioid use, unspecified, uncomplicated: Secondary | ICD-10-CM | POA: Diagnosis not present

## 2018-07-01 DIAGNOSIS — M5416 Radiculopathy, lumbar region: Secondary | ICD-10-CM | POA: Diagnosis not present

## 2018-07-01 DIAGNOSIS — M545 Low back pain: Secondary | ICD-10-CM | POA: Diagnosis not present

## 2018-07-01 DIAGNOSIS — M79642 Pain in left hand: Secondary | ICD-10-CM | POA: Diagnosis not present

## 2018-07-01 DIAGNOSIS — E782 Mixed hyperlipidemia: Secondary | ICD-10-CM | POA: Diagnosis not present

## 2018-07-01 DIAGNOSIS — G4700 Insomnia, unspecified: Secondary | ICD-10-CM | POA: Diagnosis not present

## 2018-07-01 DIAGNOSIS — E785 Hyperlipidemia, unspecified: Secondary | ICD-10-CM | POA: Diagnosis not present

## 2018-07-16 ENCOUNTER — Other Ambulatory Visit: Payer: PPO | Admitting: Vascular Surgery

## 2018-07-23 ENCOUNTER — Encounter (HOSPITAL_COMMUNITY): Payer: PPO

## 2018-07-23 ENCOUNTER — Ambulatory Visit: Payer: PPO | Admitting: Vascular Surgery

## 2018-07-30 ENCOUNTER — Other Ambulatory Visit: Payer: Self-pay | Admitting: *Deleted

## 2018-07-30 DIAGNOSIS — M545 Low back pain: Secondary | ICD-10-CM | POA: Diagnosis not present

## 2018-07-30 DIAGNOSIS — E039 Hypothyroidism, unspecified: Secondary | ICD-10-CM | POA: Diagnosis not present

## 2018-07-30 DIAGNOSIS — G8929 Other chronic pain: Secondary | ICD-10-CM | POA: Diagnosis not present

## 2018-07-30 DIAGNOSIS — R252 Cramp and spasm: Secondary | ICD-10-CM | POA: Diagnosis not present

## 2018-07-30 DIAGNOSIS — M5417 Radiculopathy, lumbosacral region: Secondary | ICD-10-CM | POA: Diagnosis not present

## 2018-07-30 DIAGNOSIS — M79641 Pain in right hand: Secondary | ICD-10-CM | POA: Diagnosis not present

## 2018-07-30 DIAGNOSIS — M79642 Pain in left hand: Secondary | ICD-10-CM | POA: Diagnosis not present

## 2018-07-30 DIAGNOSIS — I83813 Varicose veins of bilateral lower extremities with pain: Secondary | ICD-10-CM

## 2018-07-30 DIAGNOSIS — G4700 Insomnia, unspecified: Secondary | ICD-10-CM | POA: Diagnosis not present

## 2018-07-30 DIAGNOSIS — E782 Mixed hyperlipidemia: Secondary | ICD-10-CM | POA: Diagnosis not present

## 2018-07-31 DIAGNOSIS — Z79899 Other long term (current) drug therapy: Secondary | ICD-10-CM | POA: Diagnosis not present

## 2018-07-31 DIAGNOSIS — Z86718 Personal history of other venous thrombosis and embolism: Secondary | ICD-10-CM | POA: Diagnosis not present

## 2018-07-31 DIAGNOSIS — I10 Essential (primary) hypertension: Secondary | ICD-10-CM | POA: Diagnosis not present

## 2018-07-31 DIAGNOSIS — J302 Other seasonal allergic rhinitis: Secondary | ICD-10-CM | POA: Diagnosis not present

## 2018-07-31 DIAGNOSIS — E669 Obesity, unspecified: Secondary | ICD-10-CM | POA: Diagnosis not present

## 2018-07-31 DIAGNOSIS — E039 Hypothyroidism, unspecified: Secondary | ICD-10-CM | POA: Diagnosis not present

## 2018-07-31 DIAGNOSIS — R7989 Other specified abnormal findings of blood chemistry: Secondary | ICD-10-CM | POA: Diagnosis not present

## 2018-07-31 DIAGNOSIS — Z79891 Long term (current) use of opiate analgesic: Secondary | ICD-10-CM | POA: Diagnosis not present

## 2018-07-31 DIAGNOSIS — G5602 Carpal tunnel syndrome, left upper limb: Secondary | ICD-10-CM | POA: Diagnosis not present

## 2018-07-31 DIAGNOSIS — G5622 Lesion of ulnar nerve, left upper limb: Secondary | ICD-10-CM | POA: Diagnosis not present

## 2018-07-31 DIAGNOSIS — Z87891 Personal history of nicotine dependence: Secondary | ICD-10-CM | POA: Diagnosis not present

## 2018-07-31 DIAGNOSIS — M1990 Unspecified osteoarthritis, unspecified site: Secondary | ICD-10-CM | POA: Diagnosis not present

## 2018-07-31 DIAGNOSIS — E7800 Pure hypercholesterolemia, unspecified: Secondary | ICD-10-CM | POA: Diagnosis not present

## 2018-08-05 DIAGNOSIS — M79602 Pain in left arm: Secondary | ICD-10-CM | POA: Diagnosis not present

## 2018-08-05 DIAGNOSIS — M25632 Stiffness of left wrist, not elsewhere classified: Secondary | ICD-10-CM | POA: Diagnosis not present

## 2018-08-05 DIAGNOSIS — M25622 Stiffness of left elbow, not elsewhere classified: Secondary | ICD-10-CM | POA: Diagnosis not present

## 2018-08-05 DIAGNOSIS — Z4789 Encounter for other orthopedic aftercare: Secondary | ICD-10-CM | POA: Diagnosis not present

## 2018-08-11 HISTORY — PX: OTHER SURGICAL HISTORY: SHX169

## 2018-08-28 DIAGNOSIS — G4700 Insomnia, unspecified: Secondary | ICD-10-CM | POA: Diagnosis not present

## 2018-08-28 DIAGNOSIS — G8929 Other chronic pain: Secondary | ICD-10-CM | POA: Diagnosis not present

## 2018-08-28 DIAGNOSIS — M79642 Pain in left hand: Secondary | ICD-10-CM | POA: Diagnosis not present

## 2018-08-28 DIAGNOSIS — M5417 Radiculopathy, lumbosacral region: Secondary | ICD-10-CM | POA: Diagnosis not present

## 2018-08-28 DIAGNOSIS — E039 Hypothyroidism, unspecified: Secondary | ICD-10-CM | POA: Diagnosis not present

## 2018-08-28 DIAGNOSIS — R252 Cramp and spasm: Secondary | ICD-10-CM | POA: Diagnosis not present

## 2018-08-28 DIAGNOSIS — M545 Low back pain: Secondary | ICD-10-CM | POA: Diagnosis not present

## 2018-08-28 DIAGNOSIS — Z79891 Long term (current) use of opiate analgesic: Secondary | ICD-10-CM | POA: Diagnosis not present

## 2018-08-28 DIAGNOSIS — E782 Mixed hyperlipidemia: Secondary | ICD-10-CM | POA: Diagnosis not present

## 2018-08-28 DIAGNOSIS — M79641 Pain in right hand: Secondary | ICD-10-CM | POA: Diagnosis not present

## 2018-09-24 ENCOUNTER — Encounter: Payer: Self-pay | Admitting: Vascular Surgery

## 2018-09-24 ENCOUNTER — Ambulatory Visit (INDEPENDENT_AMBULATORY_CARE_PROVIDER_SITE_OTHER): Payer: PPO | Admitting: Vascular Surgery

## 2018-09-24 ENCOUNTER — Other Ambulatory Visit: Payer: Self-pay

## 2018-09-24 VITALS — BP 120/67 | HR 51 | Temp 97.3°F | Resp 18 | Ht 69.0 in | Wt 220.0 lb

## 2018-09-24 DIAGNOSIS — I83811 Varicose veins of right lower extremities with pain: Secondary | ICD-10-CM

## 2018-09-24 HISTORY — PX: ENDOVENOUS ABLATION SAPHENOUS VEIN W/ LASER: SUR449

## 2018-09-24 NOTE — Progress Notes (Signed)
     Laser Ablation Procedure    Date: 09/24/2018   Juan Young DOB:Dec 10, 1950  Consent signed: Yes    Surgeon:  Dr. Ruta Hinds   Procedure: Laser Ablation: right Small Saphenous Vein  BP 120/67 (BP Location: Left Arm, Patient Position: Sitting, Cuff Size: Large)   Pulse (!) 51   Temp (!) 97.3 F (36.3 C) (Temporal)   Resp 18   Ht 5\' 9"  (1.753 m)   Wt 220 lb (99.8 kg)   SpO2 98%   BMI 32.49 kg/m   Tumescent Anesthesia: 200 cc 0.9% NaCl with 50 cc Lidocaine HCL 1 % and 15 cc 8.4% NaHCO3  Local Anesthesia: 6 cc Lidocaine HCL and NaHCO3 (ratio 2:1)  15 watts continuous mode        Total energy: 877.92 Joules   Total time: 0:57    Patient tolerated procedure well  Notes: [name] wore facial mask.  All staff members wore facial masks and facial shields.  Description of Procedure:  After marking the course of the secondary varicosities, the patient was placed on the operating table in the prone position, and the right leg was prepped and draped in sterile fashion.   Local anesthetic was administered and under ultrasound guidance the saphenous vein was accessed with a micro needle and guide wire; then the mirco puncture sheath was placed.  A guide wire was inserted saphenopopliteal junction , followed by a 5 french sheath.  The position of the sheath and then the laser fiber below the junction was confirmed using the ultrasound.  Tumescent anesthesia was administered along the course of the saphenous vein using ultrasound guidance. The patient was placed in Trendelenburg position and protective laser glasses were placed on patient and staff, and the laser was fired at 15 watts continuous mode advancing 1-26mm/second for a total of 877.92 joules.   Steri strip was applied to the IV insertion site and ABD pads and thigh high compression stockings were applied.  Ace wrap bandages were applied  at the top of the saphenopopliteal junction. Blood loss was less than 15 cc.  The  patient ambulated out of the operating room having tolerated the procedure well.  Ruta Hinds, MD [facility] of [place] Office: [phone] Pager: [phone]

## 2018-09-29 DIAGNOSIS — M5417 Radiculopathy, lumbosacral region: Secondary | ICD-10-CM | POA: Diagnosis not present

## 2018-09-29 DIAGNOSIS — M545 Low back pain: Secondary | ICD-10-CM | POA: Diagnosis not present

## 2018-09-29 DIAGNOSIS — R7303 Prediabetes: Secondary | ICD-10-CM | POA: Diagnosis not present

## 2018-09-29 DIAGNOSIS — E039 Hypothyroidism, unspecified: Secondary | ICD-10-CM | POA: Diagnosis not present

## 2018-09-29 DIAGNOSIS — E559 Vitamin D deficiency, unspecified: Secondary | ICD-10-CM | POA: Diagnosis not present

## 2018-09-29 DIAGNOSIS — G8929 Other chronic pain: Secondary | ICD-10-CM | POA: Diagnosis not present

## 2018-09-29 DIAGNOSIS — M79641 Pain in right hand: Secondary | ICD-10-CM | POA: Diagnosis not present

## 2018-09-29 DIAGNOSIS — E782 Mixed hyperlipidemia: Secondary | ICD-10-CM | POA: Diagnosis not present

## 2018-09-29 DIAGNOSIS — Z5181 Encounter for therapeutic drug level monitoring: Secondary | ICD-10-CM | POA: Diagnosis not present

## 2018-09-29 DIAGNOSIS — G4700 Insomnia, unspecified: Secondary | ICD-10-CM | POA: Diagnosis not present

## 2018-09-29 DIAGNOSIS — I83813 Varicose veins of bilateral lower extremities with pain: Secondary | ICD-10-CM | POA: Diagnosis not present

## 2018-09-29 DIAGNOSIS — F1190 Opioid use, unspecified, uncomplicated: Secondary | ICD-10-CM | POA: Diagnosis not present

## 2018-09-29 DIAGNOSIS — M79642 Pain in left hand: Secondary | ICD-10-CM | POA: Diagnosis not present

## 2018-09-29 DIAGNOSIS — E663 Overweight: Secondary | ICD-10-CM | POA: Diagnosis not present

## 2018-10-07 ENCOUNTER — Telehealth (HOSPITAL_COMMUNITY): Payer: Self-pay

## 2018-10-07 NOTE — Telephone Encounter (Signed)
Left voicemail with [facility] instructions.

## 2018-10-08 ENCOUNTER — Other Ambulatory Visit: Payer: Self-pay

## 2018-10-08 ENCOUNTER — Encounter: Payer: Self-pay | Admitting: Vascular Surgery

## 2018-10-08 ENCOUNTER — Ambulatory Visit (INDEPENDENT_AMBULATORY_CARE_PROVIDER_SITE_OTHER): Payer: Self-pay | Admitting: Vascular Surgery

## 2018-10-08 ENCOUNTER — Ambulatory Visit (HOSPITAL_COMMUNITY)
Admission: RE | Admit: 2018-10-08 | Discharge: 2018-10-08 | Disposition: A | Discharge status: Home / Self Care | Payer: PPO | Source: Ambulatory Visit | Attending: Vascular Surgery | Admitting: Vascular Surgery

## 2018-10-08 VITALS — BP 131/68 | HR 60 | Temp 97.3°F | Resp 16 | Ht 69.0 in | Wt 226.0 lb

## 2018-10-08 DIAGNOSIS — I83813 Varicose veins of bilateral lower extremities with pain: Secondary | ICD-10-CM | POA: Insufficient documentation

## 2018-10-08 NOTE — Progress Notes (Signed)
Patient is a 68 year old male who returns for follow-up today.  He underwent laser ablation of his right lesser saphenous vein on September 24, 2018.  He has some intermittent numbness and tingling around the lateral aspect of his right ankle.  Otherwise he is doing well.  He has no pain in the knee area.  He also has varicosities in the left leg and we are considering laser ablation of the left greater saphenous vein.  He has previously had a vein stripping of his right greater saphenous vein.  He still complains of pain and thickening of the skin near the ankle in both legs.  Physical exam:  Vitals:   10/08/18 1037  BP: 131/68  Pulse: 60  Resp: 16  Temp: (!) 97.3 F (36.3 C)  TempSrc: Temporal  SpO2: 96%  Weight: 226 lb (102.5 kg)  Height: 5\' 9"  (1.753 m)    Right lower extremity well-healed incision approximately 20 large varicosities primarily in the medial calf all 4 to 6 mm diameter  Data: Venous duplex today shows unsuccessful laser ablation of the right lesser saphenous vein.  Vein diameter is about 6 mm.  Assessment: Unsuccessful laser ablation of the patient's right lesser saphenous vein.  This is most likely due to vein diameter.  I do not believe I would re-laser this.  I believe the best option at this point would be ligation of his lesser saphenous as well as stab avulsions in the right leg.  He was scheduled for laser ablation of the left greater saphenous vein in the near future.  We will defer this until we have completely addressed all the problems with his right leg.  We will call the patient regarding ligation of the right lesser saphenous and stab avulsions in the near future pending insurance approval.  Plan: See above  Ruta Hinds, MD [facility] Specialists of [place] Office: [phone] Pager: [phone]

## 2018-10-22 ENCOUNTER — Other Ambulatory Visit: Payer: PPO | Admitting: Vascular Surgery

## 2018-10-24 ENCOUNTER — Telehealth: Payer: Self-pay | Admitting: *Deleted

## 2018-10-24 NOTE — Telephone Encounter (Signed)
Left a message for patient to call back to this office to schedule surgery with Dr. Oneida Alar for varicose veins.

## 2018-10-28 ENCOUNTER — Other Ambulatory Visit: Payer: Self-pay | Admitting: *Deleted

## 2018-10-28 ENCOUNTER — Telehealth: Payer: Self-pay | Admitting: *Deleted

## 2018-10-28 ENCOUNTER — Encounter: Payer: Self-pay | Admitting: *Deleted

## 2018-10-28 NOTE — Telephone Encounter (Signed)
Phone call to patient. Pre-op instructions per letter for surgery on 11/18/2018 to arrive at admitting office at [facility] at 5:30 am reviewed with patient. Letter of instructions mailed to patient also.

## 2018-10-29 ENCOUNTER — Ambulatory Visit (HOSPITAL_COMMUNITY): Payer: PPO

## 2018-10-29 ENCOUNTER — Ambulatory Visit: Payer: PPO | Admitting: Vascular Surgery

## 2018-10-29 DIAGNOSIS — G5603 Carpal tunnel syndrome, bilateral upper limbs: Secondary | ICD-10-CM | POA: Diagnosis not present

## 2018-10-30 DIAGNOSIS — M79642 Pain in left hand: Secondary | ICD-10-CM | POA: Diagnosis not present

## 2018-10-30 DIAGNOSIS — F1190 Opioid use, unspecified, uncomplicated: Secondary | ICD-10-CM | POA: Diagnosis not present

## 2018-10-30 DIAGNOSIS — E782 Mixed hyperlipidemia: Secondary | ICD-10-CM | POA: Diagnosis not present

## 2018-10-30 DIAGNOSIS — M545 Low back pain: Secondary | ICD-10-CM | POA: Diagnosis not present

## 2018-10-30 DIAGNOSIS — M5417 Radiculopathy, lumbosacral region: Secondary | ICD-10-CM | POA: Diagnosis not present

## 2018-10-30 DIAGNOSIS — R7303 Prediabetes: Secondary | ICD-10-CM | POA: Diagnosis not present

## 2018-10-30 DIAGNOSIS — I83813 Varicose veins of bilateral lower extremities with pain: Secondary | ICD-10-CM | POA: Diagnosis not present

## 2018-10-30 DIAGNOSIS — E559 Vitamin D deficiency, unspecified: Secondary | ICD-10-CM | POA: Diagnosis not present

## 2018-10-30 DIAGNOSIS — Z5181 Encounter for therapeutic drug level monitoring: Secondary | ICD-10-CM | POA: Diagnosis not present

## 2018-10-30 DIAGNOSIS — Z79891 Long term (current) use of opiate analgesic: Secondary | ICD-10-CM | POA: Diagnosis not present

## 2018-10-30 DIAGNOSIS — G8929 Other chronic pain: Secondary | ICD-10-CM | POA: Diagnosis not present

## 2018-10-30 DIAGNOSIS — M79641 Pain in right hand: Secondary | ICD-10-CM | POA: Diagnosis not present

## 2018-11-03 DIAGNOSIS — G5602 Carpal tunnel syndrome, left upper limb: Secondary | ICD-10-CM | POA: Diagnosis not present

## 2018-11-03 DIAGNOSIS — G5622 Lesion of ulnar nerve, left upper limb: Secondary | ICD-10-CM | POA: Diagnosis not present

## 2018-11-14 ENCOUNTER — Other Ambulatory Visit: Payer: Self-pay

## 2018-11-14 ENCOUNTER — Other Ambulatory Visit (HOSPITAL_COMMUNITY)
Admission: RE | Admit: 2018-11-14 | Discharge: 2018-11-14 | Disposition: A | Discharge status: Home / Self Care | Payer: PPO | Source: Ambulatory Visit | Attending: Vascular Surgery | Admitting: Vascular Surgery

## 2018-11-14 ENCOUNTER — Encounter (HOSPITAL_COMMUNITY): Payer: Self-pay | Admitting: *Deleted

## 2018-11-14 DIAGNOSIS — Z01812 Encounter for preprocedural laboratory examination: Secondary | ICD-10-CM | POA: Diagnosis not present

## 2018-11-14 DIAGNOSIS — Z20828 Contact with and (suspected) exposure to other viral communicable diseases: Secondary | ICD-10-CM | POA: Insufficient documentation

## 2018-11-14 NOTE — Progress Notes (Signed)
Pt denies SOB, chest pain, and being under the care of a cardiologist. Pt stated that he is under the care of Dr. Bobbye Riggs, PCP. Pt denies having a stress test, echo and cardiac cath. Pt denies having an EKG and chest x ray within the last year. Pt denies recent labs. Pt made aware to stop taking vitamins, fish oil and herbal medications. Do not take any NSAIDs ie: Ibuprofen, Advil, Naproxen (Aleve), Motrin, BC and Goody Powder. Pt verbalized understanding of all pre-op instructions.

## 2018-11-16 LAB — NOVEL CORONAVIRUS, NAA (HOSP ORDER, SEND-OUT TO REF LAB; TAT 18-24 HRS): SARS-CoV-2, NAA: NOT DETECTED

## 2018-11-17 ENCOUNTER — Encounter (HOSPITAL_COMMUNITY): Payer: Self-pay | Admitting: Anesthesiology

## 2018-11-17 NOTE — Anesthesia Preprocedure Evaluation (Addendum)
Anesthesia Evaluation  Patient identified by MRN, date of birth, ID band Patient awake    Reviewed: Allergy & Precautions, NPO status , Patient's Chart, lab work & pertinent test results  Airway Mallampati: I  TM Distance: >3 FB Neck ROM: Full    Dental  (+) Poor Dentition, Chipped, Upper Dentures   Pulmonary pneumonia, resolved, former smoker,    Pulmonary exam normal breath sounds clear to auscultation       Cardiovascular Exercise Tolerance: Poor + DVT  Normal cardiovascular exam Rhythm:Regular Rate:Normal  Varicose Veins right leg Remote hx/o DVT   Neuro/Psych  Headaches, negative psych ROS   GI/Hepatic negative GI ROS, Neg liver ROS,   Endo/Other  Hypothyroidism Hypercholesterolemia Obesity  Renal/GU negative Renal ROS  negative genitourinary   Musculoskeletal  (+) Arthritis , Osteoarthritis,    Abdominal (+) + obese,   Peds  Hematology negative hematology ROS (+)   Anesthesia Other Findings   Reproductive/Obstetrics                            Anesthesia Physical Anesthesia Plan  ASA: II  Anesthesia Plan: General   Post-op Pain Management:    Induction: Intravenous  PONV Risk Score and Plan: 3 and Ondansetron, Treatment may vary due to age or medical condition and Dexamethasone  Airway Management Planned: Oral ETT  Additional Equipment:   Intra-op Plan:   Post-operative Plan: Extubation in OR  Informed Consent: I have reviewed the patients History and Physical, chart, labs and discussed the procedure including the risks, benefits and alternatives for the proposed anesthesia with the patient or authorized representative who has indicated his/her understanding and acceptance.     Dental advisory given  Plan Discussed with: Surgeon and CRNA  Anesthesia Plan Comments:       Anesthesia Quick Evaluation

## 2018-11-18 ENCOUNTER — Ambulatory Visit (HOSPITAL_COMMUNITY): Payer: PPO | Admitting: Anesthesiology

## 2018-11-18 ENCOUNTER — Encounter (HOSPITAL_COMMUNITY)
Admission: RE | Disposition: A | Discharge status: Home / Self Care | Payer: Self-pay | Source: Home / Self Care | Attending: Vascular Surgery

## 2018-11-18 ENCOUNTER — Other Ambulatory Visit: Payer: Self-pay

## 2018-11-18 ENCOUNTER — Ambulatory Visit (HOSPITAL_COMMUNITY)
Admission: RE | Admit: 2018-11-18 | Discharge: 2018-11-18 | Disposition: A | Discharge status: Home / Self Care | Payer: PPO | Attending: Vascular Surgery | Admitting: Vascular Surgery

## 2018-11-18 ENCOUNTER — Encounter (HOSPITAL_COMMUNITY): Payer: Self-pay | Admitting: *Deleted

## 2018-11-18 DIAGNOSIS — Z87891 Personal history of nicotine dependence: Secondary | ICD-10-CM | POA: Diagnosis not present

## 2018-11-18 DIAGNOSIS — E669 Obesity, unspecified: Secondary | ICD-10-CM | POA: Diagnosis not present

## 2018-11-18 DIAGNOSIS — I83811 Varicose veins of right lower extremities with pain: Secondary | ICD-10-CM | POA: Insufficient documentation

## 2018-11-18 DIAGNOSIS — E7800 Pure hypercholesterolemia, unspecified: Secondary | ICD-10-CM | POA: Diagnosis not present

## 2018-11-18 DIAGNOSIS — E039 Hypothyroidism, unspecified: Secondary | ICD-10-CM | POA: Diagnosis not present

## 2018-11-18 DIAGNOSIS — I82409 Acute embolism and thrombosis of unspecified deep veins of unspecified lower extremity: Secondary | ICD-10-CM | POA: Diagnosis not present

## 2018-11-18 DIAGNOSIS — M1990 Unspecified osteoarthritis, unspecified site: Secondary | ICD-10-CM | POA: Diagnosis not present

## 2018-11-18 DIAGNOSIS — Z6832 Body mass index (BMI) 32.0-32.9, adult: Secondary | ICD-10-CM | POA: Diagnosis not present

## 2018-11-18 DIAGNOSIS — Z86718 Personal history of other venous thrombosis and embolism: Secondary | ICD-10-CM | POA: Diagnosis not present

## 2018-11-18 DIAGNOSIS — I8391 Asymptomatic varicose veins of right lower extremity: Secondary | ICD-10-CM | POA: Diagnosis not present

## 2018-11-18 HISTORY — DX: Presence of dental prosthetic device (complete) (partial): Z97.2

## 2018-11-18 HISTORY — DX: Unspecified cataract: H26.9

## 2018-11-18 HISTORY — DX: Prediabetes: R73.03

## 2018-11-18 HISTORY — DX: Asymptomatic varicose veins of right lower extremity: I83.91

## 2018-11-18 HISTORY — DX: Unspecified osteoarthritis, unspecified site: M19.90

## 2018-11-18 HISTORY — DX: Pure hypercholesterolemia, unspecified: E78.00

## 2018-11-18 HISTORY — DX: Vitamin D deficiency, unspecified: E55.9

## 2018-11-18 HISTORY — DX: Hypothyroidism, unspecified: E03.9

## 2018-11-18 HISTORY — DX: Pneumonia, unspecified organism: J18.9

## 2018-11-18 HISTORY — DX: Headache, unspecified: R51.9

## 2018-11-18 HISTORY — PX: VEIN LIGATION AND STRIPPING: SHX2653

## 2018-11-18 LAB — CBC
HCT: 43.7 % (ref 39.0–52.0)
Hemoglobin: 14.2 g/dL (ref 13.0–17.0)
MCH: 28.6 pg (ref 26.0–34.0)
MCHC: 32.5 g/dL (ref 30.0–36.0)
MCV: 87.9 fL (ref 80.0–100.0)
Platelets: 196 10*3/uL (ref 150–400)
RBC: 4.97 MIL/uL (ref 4.22–5.81)
RDW: 12.9 % (ref 11.5–15.5)
WBC: 6.1 10*3/uL (ref 4.0–10.5)
nRBC: 0 % (ref 0.0–0.2)

## 2018-11-18 LAB — BASIC METABOLIC PANEL
Anion gap: 7 (ref 5–15)
BUN: 11 mg/dL (ref 8–23)
CO2: 25 mmol/L (ref 22–32)
Calcium: 8.9 mg/dL (ref 8.9–10.3)
Chloride: 107 mmol/L (ref 98–111)
Creatinine, Ser: 1.24 mg/dL (ref 0.61–1.24)
GFR calc Af Amer: >60 mL/min (ref >60)
GFR calc non Af Amer: >60 mL/min (ref >60)
Glucose, Bld: 112 mg/dL — ABNORMAL HIGH (ref 70–99)
Potassium: 3.7 mmol/L (ref 3.5–5.1)
Sodium: 139 mmol/L (ref 135–145)

## 2018-11-18 SURGERY — LIGATION AND STRIPPING, VARICOSE VEIN
Anesthesia: General | Laterality: Right

## 2018-11-18 MED ORDER — SUGAMMADEX SODIUM 200 MG/2ML IV SOLN
INTRAVENOUS | Status: DC | PRN
  Administered 2018-11-18: 200 mg via INTRAVENOUS

## 2018-11-18 MED ORDER — 0.9 % SODIUM CHLORIDE (POUR BTL) OPTIME
TOPICAL | Status: DC | PRN
  Administered 2018-11-18: 1000 mL

## 2018-11-18 MED ORDER — PROPOFOL 10 MG/ML IV BOLUS
INTRAVENOUS | Status: AC
  Filled 2018-11-18: qty 40

## 2018-11-18 MED ORDER — CEFAZOLIN SODIUM-DEXTROSE 2-4 GM/100ML-% IV SOLN
2.0000 g | INTRAVENOUS | Status: AC
  Administered 2018-11-18: 2 g via INTRAVENOUS
  Filled 2018-11-18: qty 100

## 2018-11-18 MED ORDER — CHLORHEXIDINE GLUCONATE 4 % EX LIQD: 60.0000 mL | Freq: Once | CUTANEOUS | Status: DC

## 2018-11-18 MED ORDER — MIDAZOLAM HCL 5 MG/5ML IJ SOLN
INTRAMUSCULAR | Status: DC | PRN
  Administered 2018-11-18: 2 mg via INTRAVENOUS

## 2018-11-18 MED ORDER — PHENYLEPHRINE 40 MCG/ML (10ML) SYRINGE FOR IV PUSH (FOR BLOOD PRESSURE SUPPORT)
PREFILLED_SYRINGE | INTRAVENOUS | Status: DC | PRN
  Administered 2018-11-18 (×4): 80 ug via INTRAVENOUS

## 2018-11-18 MED ORDER — SODIUM CHLORIDE 0.9 % IV SOLN: INTRAVENOUS | Status: DC

## 2018-11-18 MED ORDER — IBUPROFEN 600 MG PO TABS
600.0000 mg | ORAL_TABLET | Freq: Once | ORAL | Status: DC
  Filled 2018-11-18 (×2): qty 1

## 2018-11-18 MED ORDER — ROCURONIUM BROMIDE 10 MG/ML (PF) SYRINGE
PREFILLED_SYRINGE | INTRAVENOUS | Status: DC | PRN
  Administered 2018-11-18: 50 mg via INTRAVENOUS

## 2018-11-18 MED ORDER — FENTANYL CITRATE (PF) 100 MCG/2ML IJ SOLN
INTRAMUSCULAR | Status: DC | PRN
  Administered 2018-11-18: 50 ug via INTRAVENOUS
  Administered 2018-11-18: 150 ug via INTRAVENOUS

## 2018-11-18 MED ORDER — ONDANSETRON HCL 4 MG/2ML IJ SOLN
INTRAMUSCULAR | Status: DC | PRN
  Administered 2018-11-18: 4 mg via INTRAVENOUS

## 2018-11-18 MED ORDER — LACTATED RINGERS IV SOLN
INTRAVENOUS | Status: DC | PRN
  Administered 2018-11-18 (×2): via INTRAVENOUS

## 2018-11-18 MED ORDER — MIDAZOLAM HCL 2 MG/2ML IJ SOLN
INTRAMUSCULAR | Status: AC
  Filled 2018-11-18: qty 2

## 2018-11-18 MED ORDER — PROPOFOL 10 MG/ML IV BOLUS
INTRAVENOUS | Status: DC | PRN
  Administered 2018-11-18: 140 mg via INTRAVENOUS

## 2018-11-18 MED ORDER — SODIUM CHLORIDE 0.9 % IV SOLN
INTRAVENOUS | Status: AC
  Filled 2018-11-18: qty 1.2

## 2018-11-18 MED ORDER — FENTANYL CITRATE (PF) 250 MCG/5ML IJ SOLN
INTRAMUSCULAR | Status: AC
  Filled 2018-11-18: qty 5

## 2018-11-18 MED ORDER — ARTIFICIAL TEARS OPHTHALMIC OINT: TOPICAL_OINTMENT | OPHTHALMIC | Status: DC | PRN

## 2018-11-18 MED ORDER — LIDOCAINE 2% (20 MG/ML) 5 ML SYRINGE
INTRAMUSCULAR | Status: DC | PRN
  Administered 2018-11-18: 80 mg via INTRAVENOUS

## 2018-11-18 MED ORDER — DEXAMETHASONE SODIUM PHOSPHATE 10 MG/ML IJ SOLN
INTRAMUSCULAR | Status: DC | PRN
  Administered 2018-11-18: 5 mg via INTRAVENOUS

## 2018-11-18 SURGICAL SUPPLY — 48 items
BAG ISOLATION DRAPE 18X18 (DRAPES) ×1 IMPLANT
BENZOIN TINCTURE PRP APPL 2/3 (GAUZE/BANDAGES/DRESSINGS) ×3 IMPLANT
BLADE SURG 15 STRL LF DISP TIS (BLADE) IMPLANT
BLADE SURG 15 STRL SS (BLADE)
BNDG ELASTIC 4X5.8 VLCR STR LF (GAUZE/BANDAGES/DRESSINGS) ×2 IMPLANT
BNDG ELASTIC 6X10 VLCR STRL LF (GAUZE/BANDAGES/DRESSINGS) ×2 IMPLANT
BNDG ELASTIC 6X5.8 VLCR STR LF (GAUZE/BANDAGES/DRESSINGS) ×2 IMPLANT
BNDG GAUZE ELAST 4 BULKY (GAUZE/BANDAGES/DRESSINGS) ×3 IMPLANT
CANISTER SUCT 3000ML PPV (MISCELLANEOUS) ×2 IMPLANT
CANNULA VESSEL 3MM 2 BLNT TIP (CANNULA) IMPLANT
CLIP VESOCCLUDE MED 6/CT (CLIP) ×2 IMPLANT
CLIP VESOCCLUDE SM WIDE 24/CT (CLIP) ×2 IMPLANT
COVER WAND RF STERILE (DRAPES) ×2 IMPLANT
DRAPE ISOLATION BAG 18X18 (DRAPES) ×1
DRSG PAD ABDOMINAL 8X10 ST (GAUZE/BANDAGES/DRESSINGS) ×2 IMPLANT
ELECT REM PT RETURN 9FT ADLT (ELECTROSURGICAL) ×2
ELECTRODE REM PT RTRN 9FT ADLT (ELECTROSURGICAL) ×1 IMPLANT
GAUZE 4X4 16PLY RFD (DISPOSABLE) ×1 IMPLANT
GAUZE SPONGE 4X4 12PLY STRL (GAUZE/BANDAGES/DRESSINGS) ×2 IMPLANT
GLOVE BIO SURGEON STRL SZ7.5 (GLOVE) ×2 IMPLANT
GOWN STRL REUS W/ TWL LRG LVL3 (GOWN DISPOSABLE) ×3 IMPLANT
GOWN STRL REUS W/TWL LRG LVL3 (GOWN DISPOSABLE) ×3
KIT BASIN OR (CUSTOM PROCEDURE TRAY) ×2 IMPLANT
KIT TURNOVER KIT B (KITS) ×2 IMPLANT
LOOP VESSEL MAXI BLUE (MISCELLANEOUS) ×2 IMPLANT
NS IRRIG 1000ML POUR BTL (IV SOLUTION) ×2 IMPLANT
PACK GENERAL/GYN (CUSTOM PROCEDURE TRAY) ×2 IMPLANT
PACK UNIVERSAL I (CUSTOM PROCEDURE TRAY) ×2 IMPLANT
PAD ARMBOARD 7.5X6 YLW CONV (MISCELLANEOUS) ×4 IMPLANT
SLEEVE SURGEON STRL (DRAPES) ×1 IMPLANT
SPECIMEN JAR SMALL (MISCELLANEOUS) ×2 IMPLANT
SPONGE LAP 18X18 RF (DISPOSABLE) IMPLANT
STAPLER VISISTAT 35W (STAPLE) IMPLANT
STRIP CLOSURE SKIN 1/2X4 (GAUZE/BANDAGES/DRESSINGS) ×3 IMPLANT
SUT SILK 0 TIES 10X30 (SUTURE) ×2 IMPLANT
SUT SILK 2 0 (SUTURE) ×1
SUT SILK 2-0 18XBRD TIE 12 (SUTURE) ×1 IMPLANT
SUT SILK 3 0 (SUTURE) ×1
SUT SILK 3-0 18XBRD TIE 12 (SUTURE) ×1 IMPLANT
SUT SILK 4 0 (SUTURE)
SUT SILK 4-0 18XBRD TIE 12 (SUTURE) IMPLANT
SUT VIC AB 3-0 SH 27 (SUTURE) ×2
SUT VIC AB 3-0 SH 27X BRD (SUTURE) ×2 IMPLANT
SUT VIC AB 4-0 PS2 18 (SUTURE) ×1 IMPLANT
SUT VICRYL 4-0 PS2 18IN ABS (SUTURE) ×4 IMPLANT
TOWEL GREEN STERILE (TOWEL DISPOSABLE) ×2 IMPLANT
UNDERPAD 30X30 (UNDERPADS AND DIAPERS) ×2 IMPLANT
WATER STERILE IRR 1000ML POUR (IV SOLUTION) ×2 IMPLANT

## 2018-11-18 NOTE — Anesthesia Procedure Notes (Signed)
Procedure Name: Intubation Date/Time: 11/18/2018 7:47 AM Performed by: Colin Benton, CRNA Pre-anesthesia Checklist: Patient identified, Emergency Drugs available, Suction available and Patient being monitored Patient Re-evaluated:Patient Re-evaluated prior to induction Oxygen Delivery Method: Circle system utilized Preoxygenation: Pre-oxygenation with 100% oxygen Induction Type: IV induction Ventilation: Mask ventilation without difficulty and Oral airway inserted - appropriate to patient size Laryngoscope Size: Miller and 3 Grade View: Grade I Tube type: Oral Tube size: 7.5 mm Number of attempts: 1 Airway Equipment and Method: Stylet Placement Confirmation: ETT inserted through vocal cords under direct vision,  positive ETCO2 and breath sounds checked- equal and bilateral Secured at: 23 cm Tube secured with: Tape Dental Injury: Teeth and Oropharynx as per pre-operative assessment

## 2018-11-18 NOTE — H&P (Signed)
Patient is a 68 year old male who returns for follow-up today.  He underwent laser ablation of his right lesser saphenous vein on September 24, 2018.  He has some intermittent numbness and tingling around the lateral aspect of his right ankle.  Otherwise he is doing well.  He has no pain in the knee area.  He also has varicosities in the left leg and we are considering laser ablation of the left greater saphenous vein.  He has previously had a vein stripping of his right greater saphenous vein.  He still complains of pain and thickening of the skin near the ankle in both legs.  Physical exam:  Vitals:   11/18/18 0549  BP: (!) 152/76  Pulse: (!) 48  Temp: 97.7 F (36.5 C)  TempSrc: Oral  SpO2: 99%  Weight: 101.2 kg  Height: 5\' 9"  (1.753 m)    Right lower extremity well-healed incision approximately 20 large varicosities primarily in the medial calf all 4 to 6 mm diameter  Data: Venous duplex today shows unsuccessful laser ablation of the right lesser saphenous vein.  Vein diameter is about 6 mm.  Assessment: Unsuccessful laser ablation of the patient's right lesser saphenous vein.  This is most likely due to vein diameter.  I do not believe I would re-laser this.  I believe the best option at this point would be ligation of his lesser saphenous as well as stab avulsions in the right leg.  He was scheduled for laser ablation of the left greater saphenous vein in the near future.  We will defer this until we have completely addressed all the problems with his right leg.  We will call the patient regarding ligation of the right lesser saphenous and stab avulsions in the near future pending insurance approval.  Plan: See above  Ruta Hinds, MD [facility] [place] Office: [phone] Pager: [phone]

## 2018-11-18 NOTE — Op Note (Signed)
Procedure: Ligation and stripping of right lesser saphenous vein with greater than 20 stab avulsions right leg  Preoperative diagnosis: Varicose veins with pain right leg  Postoperative diagnosis: Same  Anesthesia: General  Assistant: Nurse  Operative findings: Multiple large varicosities right calf  Operative details: After informed consent, the patient was taken the operating.  The patient placed in prone position after induction of anesthesia and endotracheal ovation.  Patient's right leg was prepped and draped in usual sterile fashion.  Transverse incision was made just below the crease of the knee using ultrasound to identify the lesser saphenous popliteal junction.  Transverse incision was carried on through subcutaneous tissues down the level of the lesser saphenous vein near the junction.  It was dissected free circumferentially and ligated with a 2-0 silk tie.  Attention was then turned to the more distal portion of the lesser saphenous just above the heads of the gastrocnemius intersection.  Transverse incision was made in this location carried down through subcutaneous tissues down the level of the lesser saphenous in this location.  It was ligated distally and a stripper was then passed through the vein and approximately 7 cm of the vein stripped free.  Care was taken to try to not cause any injury to the sural nerve.  Direct pressure was held on this for a few minutes.  Attention was then turned to multiple varicosities in the calf.  Greater than 20 stab avulsions were then performed using an 11 blade.  Hemostasis was obtained with direct pressure.  Benzoin Steri-Strips were applied followed by 4 x 4's ABDs Kerlix and an Ace wrap.  The patient tolerated seizure well and there were no complications.  The instrument sponge and needle counts correct end of the case.  The patient was taken the recovery in stable condition.  Ruta Hinds, MD [facility]  [place] Office: [phone] Pager: [phone]

## 2018-11-18 NOTE — Anesthesia Postprocedure Evaluation (Signed)
Anesthesia Post Note  Patient: Juan Young  Procedure(s) Performed: VEIN LIGATION AND STRIPPING SMALL SAPHENOUS VEIN RIGHT LEG/STAB PHLEBECTOMY GREATER THAN 20 STABS RIGHT LEG (Right )     Patient location during evaluation: PACU Anesthesia Type: General Level of consciousness: awake and alert and oriented Pain management: pain level controlled Vital Signs Assessment: post-procedure vital signs reviewed and stable Respiratory status: spontaneous breathing, nonlabored ventilation and respiratory function stable Cardiovascular status: blood pressure returned to baseline and stable Postop Assessment: no apparent nausea or vomiting Anesthetic complications: no    Last Vitals:  Vitals:   11/18/18 0926 11/18/18 0941  BP: 126/76 136/77  Pulse: (!) 52 60  Resp: 13 18  Temp: 36.4 C 36.7 C  SpO2: 99% 99%    Last Pain:  Vitals:   11/18/18 0941  TempSrc:   PainSc: 0-No pain                 Shanna Un A.

## 2018-11-18 NOTE — Transfer of Care (Signed)
Immediate Anesthesia Transfer of Care Note  Patient: THADDEAUS MONICA  Procedure(s) Performed: VEIN LIGATION AND STRIPPING SMALL SAPHENOUS VEIN RIGHT LEG/STAB PHLEBECTOMY GREATER THAN 20 STABS RIGHT LEG (Right )  Patient Location: PACU  Anesthesia Type:General  Level of Consciousness: drowsy  Airway & Oxygen Therapy: Patient Spontanous Breathing and Patient connected to face mask oxygen  Post-op Assessment: Report given to RN and Post -op Vital signs reviewed and stable  Post vital signs: Reviewed and stable  Last Vitals:  Vitals Value Taken Time  BP 126/76 11/18/18 0926  Temp    Pulse 59 11/18/18 0928  Resp 17 11/18/18 0928  SpO2 99 % 11/18/18 0928  Vitals shown include unvalidated device data.  Last Pain:  Vitals:   11/18/18 0926  TempSrc:   PainSc: (P) Asleep      Patients Stated Pain Goal: 3 (60/15/61 5379)  Complications: No apparent anesthesia complications

## 2018-11-18 NOTE — Progress Notes (Signed)
MD notified of some small amount of dressing at incision site, dressing reinforced, supplies were given to patient to take home and MD verbal order to elevate extremity for 15 minutes.

## 2018-11-19 ENCOUNTER — Encounter (HOSPITAL_COMMUNITY): Payer: Self-pay | Admitting: Vascular Surgery

## 2018-11-20 ENCOUNTER — Other Ambulatory Visit: Payer: Self-pay | Admitting: *Deleted

## 2018-11-20 ENCOUNTER — Telehealth: Payer: Self-pay | Admitting: *Deleted

## 2018-11-20 NOTE — Telephone Encounter (Signed)
Returning patient's earlier telephone question about his post op dressing.  Juan Young is s/p ligation /stripping of right small saphenous vein and stab phlebectomy >20 incisions right leg on 11-18-2018 by Ruta Hinds at [facility].  Juan Young states he has taken off his dressing after 48 hours and showered but is unable to put on his thigh high compression hose. He denies bleeding or pain.  Encouraged Juan Young to keep his right leg elevated and continue wearing ace bandage wrap compression until he can resume wearing his thigh high compression hose.  Reminded him of his post-op visit with Dr. Oneida Alar scheduled for 12-11-2018.

## 2018-11-25 ENCOUNTER — Other Ambulatory Visit: Payer: Self-pay | Admitting: *Deleted

## 2018-11-25 DIAGNOSIS — I83811 Varicose veins of right lower extremities with pain: Secondary | ICD-10-CM

## 2018-12-01 DIAGNOSIS — M5417 Radiculopathy, lumbosacral region: Secondary | ICD-10-CM | POA: Diagnosis not present

## 2018-12-01 DIAGNOSIS — I83813 Varicose veins of bilateral lower extremities with pain: Secondary | ICD-10-CM | POA: Diagnosis not present

## 2018-12-01 DIAGNOSIS — M79641 Pain in right hand: Secondary | ICD-10-CM | POA: Diagnosis not present

## 2018-12-01 DIAGNOSIS — M545 Low back pain: Secondary | ICD-10-CM | POA: Diagnosis not present

## 2018-12-01 DIAGNOSIS — E782 Mixed hyperlipidemia: Secondary | ICD-10-CM | POA: Diagnosis not present

## 2018-12-01 DIAGNOSIS — G8929 Other chronic pain: Secondary | ICD-10-CM | POA: Diagnosis not present

## 2018-12-01 DIAGNOSIS — F1190 Opioid use, unspecified, uncomplicated: Secondary | ICD-10-CM | POA: Diagnosis not present

## 2018-12-01 DIAGNOSIS — R7303 Prediabetes: Secondary | ICD-10-CM | POA: Diagnosis not present

## 2018-12-01 DIAGNOSIS — E559 Vitamin D deficiency, unspecified: Secondary | ICD-10-CM | POA: Diagnosis not present

## 2018-12-01 DIAGNOSIS — Z79891 Long term (current) use of opiate analgesic: Secondary | ICD-10-CM | POA: Diagnosis not present

## 2018-12-01 DIAGNOSIS — Z5181 Encounter for therapeutic drug level monitoring: Secondary | ICD-10-CM | POA: Diagnosis not present

## 2018-12-11 ENCOUNTER — Encounter: Payer: Self-pay | Admitting: *Deleted

## 2018-12-11 ENCOUNTER — Ambulatory Visit (HOSPITAL_COMMUNITY)
Admission: RE | Admit: 2018-12-11 | Discharge: 2018-12-11 | Disposition: A | Discharge status: Home / Self Care | Payer: PPO | Source: Ambulatory Visit | Attending: Vascular Surgery | Admitting: Vascular Surgery

## 2018-12-11 ENCOUNTER — Encounter: Payer: Self-pay | Admitting: Vascular Surgery

## 2018-12-11 ENCOUNTER — Other Ambulatory Visit: Payer: Self-pay

## 2018-12-11 ENCOUNTER — Encounter (HOSPITAL_COMMUNITY): Payer: PPO

## 2018-12-11 ENCOUNTER — Ambulatory Visit (INDEPENDENT_AMBULATORY_CARE_PROVIDER_SITE_OTHER): Payer: Self-pay | Admitting: Vascular Surgery

## 2018-12-11 VITALS — BP 121/77 | HR 59 | Temp 97.8°F | Resp 20 | Ht 69.0 in | Wt 226.8 lb

## 2018-12-11 DIAGNOSIS — I83811 Varicose veins of right lower extremities with pain: Secondary | ICD-10-CM

## 2018-12-11 DIAGNOSIS — I83813 Varicose veins of bilateral lower extremities with pain: Secondary | ICD-10-CM

## 2018-12-11 NOTE — Progress Notes (Signed)
Patient is a 68 year old male who returns for postoperative follow-up today.  He underwent ligation and stripping of the right lesser saphenous vein with multiple stab avulsions in the right leg on November 18, 2018.  He still has a variety of aches and pains around his stab avulsion sites and his incision.  The reddened area around his ankle has improved.  He still has some baseline swelling.  Of note he has known reflux in the left greater saphenous vein with 4 to 5 mm vein that we have been contemplating laser ablation as well.  He has no shortness of breath.  He has no chest pain.  Physical exam:  Vitals:   12/11/18 1547  BP: 121/77  Pulse: (!) 59  Resp: 20  Temp: 97.8 F (36.6 C)  SpO2: 95%  Weight: 226 lb 12.8 oz (102.9 kg)  Height: 5\' 9"  (1.753 m)    Right lower extremity: Well-healed incisions no erythema no drainage  Data: Duplex ultrasound was performed today which shows the lesser saphenous vein is not absent.  There are still a few scattered varicosities.  The right greater saphenous vein essentially absent with some remnants below the knee on the right side.  There was no DVT.  Assessment: Slowly improving status post ligation and stripping of right lesser saphenous vein.  Plan: Patient was continue to wear his compression stockings.  Hopefully his pain symptoms will resolve over the next few weeks.  He will call us back in the future if he wishes to consider laser ablation of his left greater saphenous vein for improvement of symptoms in that leg where he has erythema around the ankle and swelling.  Ruta Hinds, MD [facility] [place] Office: [phone] Pager: [phone]

## 2019-01-01 DIAGNOSIS — E663 Overweight: Secondary | ICD-10-CM | POA: Diagnosis not present

## 2019-01-01 DIAGNOSIS — M545 Low back pain: Secondary | ICD-10-CM | POA: Diagnosis not present

## 2019-01-01 DIAGNOSIS — Z79891 Long term (current) use of opiate analgesic: Secondary | ICD-10-CM | POA: Diagnosis not present

## 2019-01-01 DIAGNOSIS — I83813 Varicose veins of bilateral lower extremities with pain: Secondary | ICD-10-CM | POA: Diagnosis not present

## 2019-01-01 DIAGNOSIS — M542 Cervicalgia: Secondary | ICD-10-CM | POA: Diagnosis not present

## 2019-01-01 DIAGNOSIS — E559 Vitamin D deficiency, unspecified: Secondary | ICD-10-CM | POA: Diagnosis not present

## 2019-01-01 DIAGNOSIS — Z5181 Encounter for therapeutic drug level monitoring: Secondary | ICD-10-CM | POA: Diagnosis not present

## 2019-01-01 DIAGNOSIS — M5417 Radiculopathy, lumbosacral region: Secondary | ICD-10-CM | POA: Diagnosis not present

## 2019-01-01 DIAGNOSIS — F1190 Opioid use, unspecified, uncomplicated: Secondary | ICD-10-CM | POA: Diagnosis not present

## 2019-01-01 DIAGNOSIS — E039 Hypothyroidism, unspecified: Secondary | ICD-10-CM | POA: Diagnosis not present

## 2019-01-01 DIAGNOSIS — M79641 Pain in right hand: Secondary | ICD-10-CM | POA: Diagnosis not present

## 2019-01-01 DIAGNOSIS — G8929 Other chronic pain: Secondary | ICD-10-CM | POA: Diagnosis not present

## 2019-01-14 DIAGNOSIS — E782 Mixed hyperlipidemia: Secondary | ICD-10-CM | POA: Diagnosis not present

## 2019-01-14 DIAGNOSIS — G5603 Carpal tunnel syndrome, bilateral upper limbs: Secondary | ICD-10-CM | POA: Diagnosis not present

## 2019-01-14 DIAGNOSIS — E6609 Other obesity due to excess calories: Secondary | ICD-10-CM | POA: Diagnosis not present

## 2019-01-14 DIAGNOSIS — R0602 Shortness of breath: Secondary | ICD-10-CM | POA: Diagnosis not present

## 2019-01-30 DIAGNOSIS — I83813 Varicose veins of bilateral lower extremities with pain: Secondary | ICD-10-CM | POA: Diagnosis not present

## 2019-01-30 DIAGNOSIS — G8929 Other chronic pain: Secondary | ICD-10-CM | POA: Diagnosis not present

## 2019-01-30 DIAGNOSIS — M545 Low back pain: Secondary | ICD-10-CM | POA: Diagnosis not present

## 2019-01-30 DIAGNOSIS — M79601 Pain in right arm: Secondary | ICD-10-CM | POA: Diagnosis not present

## 2019-01-30 DIAGNOSIS — M79641 Pain in right hand: Secondary | ICD-10-CM | POA: Diagnosis not present

## 2019-01-30 DIAGNOSIS — Z79891 Long term (current) use of opiate analgesic: Secondary | ICD-10-CM | POA: Diagnosis not present

## 2019-01-30 DIAGNOSIS — Z5181 Encounter for therapeutic drug level monitoring: Secondary | ICD-10-CM | POA: Diagnosis not present

## 2019-01-30 DIAGNOSIS — M5417 Radiculopathy, lumbosacral region: Secondary | ICD-10-CM | POA: Diagnosis not present

## 2019-01-30 DIAGNOSIS — Z79899 Other long term (current) drug therapy: Secondary | ICD-10-CM | POA: Diagnosis not present

## 2019-01-30 DIAGNOSIS — F1190 Opioid use, unspecified, uncomplicated: Secondary | ICD-10-CM | POA: Diagnosis not present

## 2019-02-04 DIAGNOSIS — Z03818 Encounter for observation for suspected exposure to other biological agents ruled out: Secondary | ICD-10-CM | POA: Diagnosis not present

## 2019-02-04 DIAGNOSIS — Z20828 Contact with and (suspected) exposure to other viral communicable diseases: Secondary | ICD-10-CM | POA: Diagnosis not present

## 2019-02-24 DIAGNOSIS — M7702 Medial epicondylitis, left elbow: Secondary | ICD-10-CM | POA: Diagnosis not present

## 2019-02-24 DIAGNOSIS — G5622 Lesion of ulnar nerve, left upper limb: Secondary | ICD-10-CM | POA: Diagnosis not present

## 2019-03-02 DIAGNOSIS — Z5181 Encounter for therapeutic drug level monitoring: Secondary | ICD-10-CM | POA: Diagnosis not present

## 2019-03-02 DIAGNOSIS — G8929 Other chronic pain: Secondary | ICD-10-CM | POA: Diagnosis not present

## 2019-03-02 DIAGNOSIS — I83813 Varicose veins of bilateral lower extremities with pain: Secondary | ICD-10-CM | POA: Diagnosis not present

## 2019-03-02 DIAGNOSIS — M5417 Radiculopathy, lumbosacral region: Secondary | ICD-10-CM | POA: Diagnosis not present

## 2019-03-02 DIAGNOSIS — I10 Essential (primary) hypertension: Secondary | ICD-10-CM | POA: Diagnosis not present

## 2019-03-02 DIAGNOSIS — M79641 Pain in right hand: Secondary | ICD-10-CM | POA: Diagnosis not present

## 2019-03-02 DIAGNOSIS — M545 Low back pain: Secondary | ICD-10-CM | POA: Diagnosis not present

## 2019-03-02 DIAGNOSIS — Z79891 Long term (current) use of opiate analgesic: Secondary | ICD-10-CM | POA: Diagnosis not present

## 2019-03-02 DIAGNOSIS — F1190 Opioid use, unspecified, uncomplicated: Secondary | ICD-10-CM | POA: Diagnosis not present

## 2019-03-10 ENCOUNTER — Encounter: Payer: Self-pay | Admitting: Vascular Surgery

## 2019-03-20 DIAGNOSIS — E782 Mixed hyperlipidemia: Secondary | ICD-10-CM | POA: Diagnosis not present

## 2019-03-20 DIAGNOSIS — I10 Essential (primary) hypertension: Secondary | ICD-10-CM | POA: Diagnosis not present

## 2019-03-20 DIAGNOSIS — E039 Hypothyroidism, unspecified: Secondary | ICD-10-CM | POA: Diagnosis not present

## 2019-03-20 DIAGNOSIS — R42 Dizziness and giddiness: Secondary | ICD-10-CM | POA: Diagnosis not present

## 2019-04-01 DIAGNOSIS — M4650 Other infective spondylopathies, site unspecified: Secondary | ICD-10-CM | POA: Diagnosis not present

## 2019-04-01 DIAGNOSIS — F1190 Opioid use, unspecified, uncomplicated: Secondary | ICD-10-CM | POA: Diagnosis not present

## 2019-04-01 DIAGNOSIS — Z5181 Encounter for therapeutic drug level monitoring: Secondary | ICD-10-CM | POA: Diagnosis not present

## 2019-04-01 DIAGNOSIS — M25552 Pain in left hip: Secondary | ICD-10-CM | POA: Diagnosis not present

## 2019-04-01 DIAGNOSIS — I83813 Varicose veins of bilateral lower extremities with pain: Secondary | ICD-10-CM | POA: Diagnosis not present

## 2019-04-01 DIAGNOSIS — M79641 Pain in right hand: Secondary | ICD-10-CM | POA: Diagnosis not present

## 2019-04-01 DIAGNOSIS — G8929 Other chronic pain: Secondary | ICD-10-CM | POA: Diagnosis not present

## 2019-04-01 DIAGNOSIS — M7702 Medial epicondylitis, left elbow: Secondary | ICD-10-CM | POA: Diagnosis not present

## 2019-04-01 DIAGNOSIS — M5417 Radiculopathy, lumbosacral region: Secondary | ICD-10-CM | POA: Diagnosis not present

## 2019-04-01 DIAGNOSIS — I10 Essential (primary) hypertension: Secondary | ICD-10-CM | POA: Diagnosis not present

## 2019-04-01 DIAGNOSIS — Z79891 Long term (current) use of opiate analgesic: Secondary | ICD-10-CM | POA: Diagnosis not present

## 2019-04-29 DIAGNOSIS — M5417 Radiculopathy, lumbosacral region: Secondary | ICD-10-CM | POA: Diagnosis not present

## 2019-04-29 DIAGNOSIS — G8929 Other chronic pain: Secondary | ICD-10-CM | POA: Diagnosis not present

## 2019-04-29 DIAGNOSIS — M25552 Pain in left hip: Secondary | ICD-10-CM | POA: Diagnosis not present

## 2019-04-29 DIAGNOSIS — Z79891 Long term (current) use of opiate analgesic: Secondary | ICD-10-CM | POA: Diagnosis not present

## 2019-05-06 ENCOUNTER — Encounter: Payer: Self-pay | Admitting: Vascular Surgery

## 2019-05-06 ENCOUNTER — Other Ambulatory Visit: Payer: Self-pay

## 2019-05-06 ENCOUNTER — Ambulatory Visit (INDEPENDENT_AMBULATORY_CARE_PROVIDER_SITE_OTHER): Payer: PPO | Admitting: Vascular Surgery

## 2019-05-06 VITALS — BP 122/69 | HR 62 | Temp 97.7°F | Resp 18 | Ht 69.0 in | Wt 220.0 lb

## 2019-05-06 DIAGNOSIS — M79604 Pain in right leg: Secondary | ICD-10-CM | POA: Diagnosis not present

## 2019-05-06 DIAGNOSIS — M79605 Pain in left leg: Secondary | ICD-10-CM

## 2019-05-06 NOTE — Progress Notes (Signed)
Patient is a 69 year old male referred for right leg pain.  Patient previously had undergone stripping of the right lesser saphenous vein multiple stab avulsions in October 2020.  Spite this procedure he continues to have persistent pain in both legs.  He has noticed several nodules in the right and left leg.  He now has some redness and tenderness around 1 of these nodules on the right leg.  He does not describe claudication symptoms.  He does not have rest pain.  He has not had any significant increase in swelling in his legs.  He does have intermittent numbness and tingling around the lateral aspect of his ankle.  He has no shortness of breath or chest pain.  He feels like the areas of nodules are continuing to spread more proximally.  Past Medical History:  Diagnosis Date  . Arthritis   . Blood clot in vein    69 years old  . Borderline diabetes   . Cataract   . Headache   . Hypercholesteremia   . Hypothyroidism   . Pneumonia   . Varicose vein of leg   . Varicose veins of right lower extremity   . Vitamin D deficiency   . Wears dentures     Past Surgical History:  Procedure Laterality Date  . carpel tunnel surgery Left 08/2018  . ENDOVENOUS ABLATION SAPHENOUS VEIN W/ LASER Right 09/24/2018   endovenous laser ablation right small saphenous vein by Fabienne Bruns MD   . MULTIPLE TOOTH EXTRACTIONS    . ROTATOR CUFF REPAIR Bilateral   . VEIN LIGATION AND STRIPPING Right 11/18/2018   Procedure: VEIN LIGATION AND STRIPPING SMALL SAPHENOUS VEIN RIGHT LEG/STAB PHLEBECTOMY GREATER THAN 20 STABS RIGHT LEG;  Surgeon: Sherren Kerns, MD;  Location: [facility] OR;  Service: Vascular;  Laterality: Right;    Physical exam:  Vitals:   05/06/19 1503  BP: 122/69  Pulse: 62  Resp: 18  Temp: 97.7 F (36.5 C)  TempSrc: Temporal  Weight: 220 lb (99.8 kg)  Height: 5\' 9"  (1.753 m)    Extremities: 2+ posterior tibial pulses bilaterally  Skin: Scattered reticular type varicosities diffusely both  legs, subcutaneous nodules approximately 5 on the gaiter aspect of both legs each nodule is about 3 cm in diameter.  There is 1 in the right leg which is slightly erythematous and tender to palpation.  He also has mild tenderness over these nodules in the left leg.     Assessment: Painful subcutaneous nodules bilateral lower extremities.  These do not have the classic appearance of varicose veins.  Potential leg as this could represent a thrombophlebitis of his prior varicosities.  However this is a fairly unusual presentation.  In light of this we will have the patient evaluated by rheumatology to see if they can give any further information on what may be causing these nodules.  I will see the patient back in follow-up after his rheumatology appointment.  If there is no obvious rheumatologic diagnosis we could revisit an ultrasound of the legs I did not feel that this was going to give much information today as again this does not appear to be typical of venous disease.  Plan: See above  , MD [facility] [place] Office: [phone]

## 2019-05-08 DIAGNOSIS — M5417 Radiculopathy, lumbosacral region: Secondary | ICD-10-CM | POA: Diagnosis not present

## 2019-05-08 DIAGNOSIS — M4650 Other infective spondylopathies, site unspecified: Secondary | ICD-10-CM | POA: Diagnosis not present

## 2019-05-08 DIAGNOSIS — M79641 Pain in right hand: Secondary | ICD-10-CM | POA: Diagnosis not present

## 2019-05-08 DIAGNOSIS — Z79899 Other long term (current) drug therapy: Secondary | ICD-10-CM | POA: Diagnosis not present

## 2019-05-13 DIAGNOSIS — M7702 Medial epicondylitis, left elbow: Secondary | ICD-10-CM | POA: Diagnosis not present

## 2019-05-18 DIAGNOSIS — M359 Systemic involvement of connective tissue, unspecified: Secondary | ICD-10-CM | POA: Diagnosis not present

## 2019-05-18 DIAGNOSIS — M1990 Unspecified osteoarthritis, unspecified site: Secondary | ICD-10-CM | POA: Diagnosis not present

## 2019-05-18 DIAGNOSIS — L52 Erythema nodosum: Secondary | ICD-10-CM | POA: Diagnosis not present

## 2019-05-18 DIAGNOSIS — L959 Vasculitis limited to the skin, unspecified: Secondary | ICD-10-CM | POA: Diagnosis not present

## 2019-05-18 DIAGNOSIS — M7989 Other specified soft tissue disorders: Secondary | ICD-10-CM | POA: Diagnosis not present

## 2019-05-18 DIAGNOSIS — M79604 Pain in right leg: Secondary | ICD-10-CM | POA: Diagnosis not present

## 2019-05-28 ENCOUNTER — Other Ambulatory Visit: Payer: Self-pay

## 2019-05-28 ENCOUNTER — Encounter: Payer: Self-pay | Admitting: Vascular Surgery

## 2019-05-28 ENCOUNTER — Ambulatory Visit (INDEPENDENT_AMBULATORY_CARE_PROVIDER_SITE_OTHER): Payer: PPO | Admitting: Vascular Surgery

## 2019-05-28 VITALS — BP 130/79 | HR 81 | Temp 97.2°F | Resp 16 | Ht 69.0 in | Wt 225.0 lb

## 2019-05-28 DIAGNOSIS — I83813 Varicose veins of bilateral lower extremities with pain: Secondary | ICD-10-CM

## 2019-05-28 NOTE — Progress Notes (Signed)
Patient is a 69 year old male who returns for further follow-up today.  He has previously undergone stripping of his right lesser saphenous vein and stab avulsions.  He did not really get any relief of his symptoms.  He has several subcutaneous nodules in his right leg that are very tender to palpation.  These do not appear to be varicosities.  These have not really improved since his last office visit February 24.  In the meanwhile he was referred to rheumatology for further evaluation.  I spoke with Dr. Kathi Ludwig today regarding his evaluation.  All of his blood work turned out normal with no significant rheumatologic disease.  She was wondering whether or not it might be subcutaneous vasculitis or scleroderma and has recommended that he get a biopsy of these nodules.  This is pending.  The patient is continue to wear his compression stockings.  However, he states he really does not really see any difference when he wears them or not as far as his pain is concerned.  Left leg also has some nodules but these are not as bad.  Review of systems: He has no shortness of breath.  He has no chest pain.  Physical exam:  Vitals:   05/28/19 1302  BP: 130/79  Pulse: 81  Resp: 16  Temp: (!) 97.2 F (36.2 C)  TempSrc: Temporal  SpO2: 93%  Weight: 225 lb (102.1 kg)  Height: 5\' 9"  (1.753 m)    Extremities: Erythema right posterior distal calf with 2 subcutaneous nodules very tender to palpation these are macular flat nodules about 2 cm diameter.  They do not feel like thrombosed varicosities or varicose veins.  Assessment: Painful subcutaneous nodules right leg.  I doubt that this is secondary to varicose veins.  Plan: The patient will have his skin biopsy in the near future.  He will call me for further follow-up appointment after his biopsy has been performed we have the results of this.  , MD [facility] [place] Office: [phone]

## 2019-05-29 DIAGNOSIS — I83813 Varicose veins of bilateral lower extremities with pain: Secondary | ICD-10-CM | POA: Diagnosis not present

## 2019-05-29 DIAGNOSIS — F1190 Opioid use, unspecified, uncomplicated: Secondary | ICD-10-CM | POA: Diagnosis not present

## 2019-05-29 DIAGNOSIS — L820 Inflamed seborrheic keratosis: Secondary | ICD-10-CM | POA: Diagnosis not present

## 2019-05-29 DIAGNOSIS — Z5181 Encounter for therapeutic drug level monitoring: Secondary | ICD-10-CM | POA: Diagnosis not present

## 2019-05-29 DIAGNOSIS — R2240 Localized swelling, mass and lump, unspecified lower limb: Secondary | ICD-10-CM | POA: Diagnosis not present

## 2019-05-29 DIAGNOSIS — M25552 Pain in left hip: Secondary | ICD-10-CM | POA: Diagnosis not present

## 2019-05-29 DIAGNOSIS — Z79891 Long term (current) use of opiate analgesic: Secondary | ICD-10-CM | POA: Diagnosis not present

## 2019-05-29 DIAGNOSIS — I10 Essential (primary) hypertension: Secondary | ICD-10-CM | POA: Diagnosis not present

## 2019-05-29 DIAGNOSIS — L83 Acanthosis nigricans: Secondary | ICD-10-CM | POA: Diagnosis not present

## 2019-05-29 DIAGNOSIS — L089 Local infection of the skin and subcutaneous tissue, unspecified: Secondary | ICD-10-CM | POA: Diagnosis not present

## 2019-05-29 DIAGNOSIS — L905 Scar conditions and fibrosis of skin: Secondary | ICD-10-CM | POA: Diagnosis not present

## 2019-05-29 DIAGNOSIS — G8929 Other chronic pain: Secondary | ICD-10-CM | POA: Diagnosis not present

## 2019-05-29 DIAGNOSIS — M5417 Radiculopathy, lumbosacral region: Secondary | ICD-10-CM | POA: Diagnosis not present

## 2019-06-24 DIAGNOSIS — T1501XA Foreign body in cornea, right eye, initial encounter: Secondary | ICD-10-CM | POA: Diagnosis not present

## 2019-06-29 DIAGNOSIS — M25552 Pain in left hip: Secondary | ICD-10-CM | POA: Diagnosis not present

## 2019-06-29 DIAGNOSIS — M5417 Radiculopathy, lumbosacral region: Secondary | ICD-10-CM | POA: Diagnosis not present

## 2019-06-29 DIAGNOSIS — F1190 Opioid use, unspecified, uncomplicated: Secondary | ICD-10-CM | POA: Diagnosis not present

## 2019-06-29 DIAGNOSIS — Z5181 Encounter for therapeutic drug level monitoring: Secondary | ICD-10-CM | POA: Diagnosis not present

## 2019-06-29 DIAGNOSIS — I10 Essential (primary) hypertension: Secondary | ICD-10-CM | POA: Diagnosis not present

## 2019-06-29 DIAGNOSIS — G8929 Other chronic pain: Secondary | ICD-10-CM | POA: Diagnosis not present

## 2019-06-29 DIAGNOSIS — E782 Mixed hyperlipidemia: Secondary | ICD-10-CM | POA: Diagnosis not present

## 2019-06-29 DIAGNOSIS — I83813 Varicose veins of bilateral lower extremities with pain: Secondary | ICD-10-CM | POA: Diagnosis not present

## 2019-06-29 DIAGNOSIS — Z79891 Long term (current) use of opiate analgesic: Secondary | ICD-10-CM | POA: Diagnosis not present

## 2019-06-30 DIAGNOSIS — I10 Essential (primary) hypertension: Secondary | ICD-10-CM | POA: Diagnosis not present

## 2019-06-30 DIAGNOSIS — E782 Mixed hyperlipidemia: Secondary | ICD-10-CM | POA: Diagnosis not present

## 2019-07-01 DIAGNOSIS — H527 Unspecified disorder of refraction: Secondary | ICD-10-CM | POA: Diagnosis not present

## 2019-07-01 DIAGNOSIS — T1501XD Foreign body in cornea, right eye, subsequent encounter: Secondary | ICD-10-CM | POA: Diagnosis not present

## 2019-07-01 DIAGNOSIS — H25813 Combined forms of age-related cataract, bilateral: Secondary | ICD-10-CM | POA: Diagnosis not present

## 2019-07-02 DIAGNOSIS — R2240 Localized swelling, mass and lump, unspecified lower limb: Secondary | ICD-10-CM | POA: Diagnosis not present

## 2019-07-13 DIAGNOSIS — M7581 Other shoulder lesions, right shoulder: Secondary | ICD-10-CM | POA: Diagnosis not present

## 2019-07-29 DIAGNOSIS — Z79891 Long term (current) use of opiate analgesic: Secondary | ICD-10-CM | POA: Diagnosis not present

## 2019-07-29 DIAGNOSIS — Z760 Encounter for issue of repeat prescription: Secondary | ICD-10-CM | POA: Diagnosis not present

## 2019-07-29 DIAGNOSIS — G8929 Other chronic pain: Secondary | ICD-10-CM | POA: Diagnosis not present

## 2019-07-29 DIAGNOSIS — F1190 Opioid use, unspecified, uncomplicated: Secondary | ICD-10-CM | POA: Diagnosis not present

## 2019-07-29 DIAGNOSIS — M5417 Radiculopathy, lumbosacral region: Secondary | ICD-10-CM | POA: Diagnosis not present

## 2019-07-29 DIAGNOSIS — Z5181 Encounter for therapeutic drug level monitoring: Secondary | ICD-10-CM | POA: Diagnosis not present

## 2019-07-29 DIAGNOSIS — I10 Essential (primary) hypertension: Secondary | ICD-10-CM | POA: Diagnosis not present

## 2019-07-29 DIAGNOSIS — M25552 Pain in left hip: Secondary | ICD-10-CM | POA: Diagnosis not present

## 2019-08-26 DIAGNOSIS — I10 Essential (primary) hypertension: Secondary | ICD-10-CM | POA: Diagnosis not present

## 2019-08-26 DIAGNOSIS — F1190 Opioid use, unspecified, uncomplicated: Secondary | ICD-10-CM | POA: Diagnosis not present

## 2019-08-26 DIAGNOSIS — Z760 Encounter for issue of repeat prescription: Secondary | ICD-10-CM | POA: Diagnosis not present

## 2019-08-26 DIAGNOSIS — L089 Local infection of the skin and subcutaneous tissue, unspecified: Secondary | ICD-10-CM | POA: Diagnosis not present

## 2019-08-26 DIAGNOSIS — G8929 Other chronic pain: Secondary | ICD-10-CM | POA: Diagnosis not present

## 2019-08-26 DIAGNOSIS — M5417 Radiculopathy, lumbosacral region: Secondary | ICD-10-CM | POA: Diagnosis not present

## 2019-08-26 DIAGNOSIS — Z79891 Long term (current) use of opiate analgesic: Secondary | ICD-10-CM | POA: Diagnosis not present

## 2019-08-26 DIAGNOSIS — Z5181 Encounter for therapeutic drug level monitoring: Secondary | ICD-10-CM | POA: Diagnosis not present

## 2019-08-26 DIAGNOSIS — M25552 Pain in left hip: Secondary | ICD-10-CM | POA: Diagnosis not present

## 2019-08-26 DIAGNOSIS — Z79899 Other long term (current) drug therapy: Secondary | ICD-10-CM | POA: Diagnosis not present

## 2019-08-26 DIAGNOSIS — A770 Spotted fever due to Rickettsia rickettsii: Secondary | ICD-10-CM | POA: Diagnosis not present

## 2019-09-10 DIAGNOSIS — M5412 Radiculopathy, cervical region: Secondary | ICD-10-CM | POA: Diagnosis not present

## 2019-09-24 DIAGNOSIS — F1190 Opioid use, unspecified, uncomplicated: Secondary | ICD-10-CM | POA: Diagnosis not present

## 2019-09-24 DIAGNOSIS — M5417 Radiculopathy, lumbosacral region: Secondary | ICD-10-CM | POA: Diagnosis not present

## 2019-09-24 DIAGNOSIS — Z5181 Encounter for therapeutic drug level monitoring: Secondary | ICD-10-CM | POA: Diagnosis not present

## 2019-09-24 DIAGNOSIS — G8929 Other chronic pain: Secondary | ICD-10-CM | POA: Diagnosis not present

## 2019-09-24 DIAGNOSIS — Z79891 Long term (current) use of opiate analgesic: Secondary | ICD-10-CM | POA: Diagnosis not present

## 2019-09-24 DIAGNOSIS — Z760 Encounter for issue of repeat prescription: Secondary | ICD-10-CM | POA: Diagnosis not present

## 2019-09-24 DIAGNOSIS — M25552 Pain in left hip: Secondary | ICD-10-CM | POA: Diagnosis not present

## 2019-09-24 DIAGNOSIS — I83813 Varicose veins of bilateral lower extremities with pain: Secondary | ICD-10-CM | POA: Diagnosis not present

## 2019-09-24 DIAGNOSIS — M25551 Pain in right hip: Secondary | ICD-10-CM | POA: Diagnosis not present

## 2019-09-24 DIAGNOSIS — I10 Essential (primary) hypertension: Secondary | ICD-10-CM | POA: Diagnosis not present

## 2019-10-02 ENCOUNTER — Other Ambulatory Visit: Payer: Self-pay | Admitting: Physician Assistant

## 2019-10-02 DIAGNOSIS — M25511 Pain in right shoulder: Secondary | ICD-10-CM

## 2019-10-02 DIAGNOSIS — M5412 Radiculopathy, cervical region: Secondary | ICD-10-CM

## 2019-10-23 DIAGNOSIS — I998 Other disorder of circulatory system: Secondary | ICD-10-CM | POA: Diagnosis not present

## 2019-10-23 DIAGNOSIS — Z79891 Long term (current) use of opiate analgesic: Secondary | ICD-10-CM | POA: Diagnosis not present

## 2019-10-23 DIAGNOSIS — M5417 Radiculopathy, lumbosacral region: Secondary | ICD-10-CM | POA: Diagnosis not present

## 2019-10-23 DIAGNOSIS — I10 Essential (primary) hypertension: Secondary | ICD-10-CM | POA: Diagnosis not present

## 2019-10-23 DIAGNOSIS — Z760 Encounter for issue of repeat prescription: Secondary | ICD-10-CM | POA: Diagnosis not present

## 2019-10-23 DIAGNOSIS — M25552 Pain in left hip: Secondary | ICD-10-CM | POA: Diagnosis not present

## 2019-10-23 DIAGNOSIS — Z5181 Encounter for therapeutic drug level monitoring: Secondary | ICD-10-CM | POA: Diagnosis not present

## 2019-10-23 DIAGNOSIS — G8929 Other chronic pain: Secondary | ICD-10-CM | POA: Diagnosis not present

## 2019-10-23 DIAGNOSIS — F1190 Opioid use, unspecified, uncomplicated: Secondary | ICD-10-CM | POA: Diagnosis not present

## 2019-10-28 ENCOUNTER — Other Ambulatory Visit: Payer: PPO

## 2019-10-28 ENCOUNTER — Ambulatory Visit
Admission: RE | Admit: 2019-10-28 | Discharge: 2019-10-28 | Disposition: A | Discharge status: Home / Self Care | Payer: PPO | Source: Ambulatory Visit | Attending: Physician Assistant | Admitting: Physician Assistant

## 2019-10-28 ENCOUNTER — Other Ambulatory Visit: Payer: Self-pay

## 2019-10-28 DIAGNOSIS — M5412 Radiculopathy, cervical region: Secondary | ICD-10-CM

## 2019-10-28 DIAGNOSIS — M4802 Spinal stenosis, cervical region: Secondary | ICD-10-CM | POA: Diagnosis not present

## 2019-10-28 DIAGNOSIS — M25511 Pain in right shoulder: Secondary | ICD-10-CM | POA: Diagnosis not present

## 2019-11-03 DIAGNOSIS — M5412 Radiculopathy, cervical region: Secondary | ICD-10-CM | POA: Diagnosis not present

## 2019-11-03 DIAGNOSIS — M7581 Other shoulder lesions, right shoulder: Secondary | ICD-10-CM | POA: Diagnosis not present

## 2019-11-13 DIAGNOSIS — M5011 Cervical disc disorder with radiculopathy,  high cervical region: Secondary | ICD-10-CM | POA: Diagnosis not present

## 2019-11-13 DIAGNOSIS — M5012 Mid-cervical disc disorder, unspecified level: Secondary | ICD-10-CM | POA: Diagnosis not present

## 2019-11-13 DIAGNOSIS — M50121 Cervical disc disorder at C4-C5 level with radiculopathy: Secondary | ICD-10-CM | POA: Diagnosis not present

## 2019-11-23 DIAGNOSIS — G8929 Other chronic pain: Secondary | ICD-10-CM | POA: Diagnosis not present

## 2019-11-23 DIAGNOSIS — Z5181 Encounter for therapeutic drug level monitoring: Secondary | ICD-10-CM | POA: Diagnosis not present

## 2019-11-23 DIAGNOSIS — Z760 Encounter for issue of repeat prescription: Secondary | ICD-10-CM | POA: Diagnosis not present

## 2019-11-23 DIAGNOSIS — Z79899 Other long term (current) drug therapy: Secondary | ICD-10-CM | POA: Diagnosis not present

## 2019-11-23 DIAGNOSIS — M5417 Radiculopathy, lumbosacral region: Secondary | ICD-10-CM | POA: Diagnosis not present

## 2019-11-23 DIAGNOSIS — I83813 Varicose veins of bilateral lower extremities with pain: Secondary | ICD-10-CM | POA: Diagnosis not present

## 2019-11-23 DIAGNOSIS — M25552 Pain in left hip: Secondary | ICD-10-CM | POA: Diagnosis not present

## 2019-11-23 DIAGNOSIS — Z79891 Long term (current) use of opiate analgesic: Secondary | ICD-10-CM | POA: Diagnosis not present

## 2019-11-23 DIAGNOSIS — I10 Essential (primary) hypertension: Secondary | ICD-10-CM | POA: Diagnosis not present

## 2019-11-23 DIAGNOSIS — F1190 Opioid use, unspecified, uncomplicated: Secondary | ICD-10-CM | POA: Diagnosis not present

## 2019-12-11 DIAGNOSIS — Z03818 Encounter for observation for suspected exposure to other biological agents ruled out: Secondary | ICD-10-CM | POA: Diagnosis not present

## 2019-12-22 DIAGNOSIS — Z760 Encounter for issue of repeat prescription: Secondary | ICD-10-CM | POA: Diagnosis not present

## 2019-12-22 DIAGNOSIS — I83812 Varicose veins of left lower extremities with pain: Secondary | ICD-10-CM | POA: Diagnosis not present

## 2019-12-22 DIAGNOSIS — Z5181 Encounter for therapeutic drug level monitoring: Secondary | ICD-10-CM | POA: Diagnosis not present

## 2019-12-22 DIAGNOSIS — I83813 Varicose veins of bilateral lower extremities with pain: Secondary | ICD-10-CM | POA: Diagnosis not present

## 2019-12-22 DIAGNOSIS — I10 Essential (primary) hypertension: Secondary | ICD-10-CM | POA: Diagnosis not present

## 2019-12-22 DIAGNOSIS — M25552 Pain in left hip: Secondary | ICD-10-CM | POA: Diagnosis not present

## 2019-12-22 DIAGNOSIS — M5417 Radiculopathy, lumbosacral region: Secondary | ICD-10-CM | POA: Diagnosis not present

## 2019-12-22 DIAGNOSIS — Z79891 Long term (current) use of opiate analgesic: Secondary | ICD-10-CM | POA: Diagnosis not present

## 2019-12-22 DIAGNOSIS — G8929 Other chronic pain: Secondary | ICD-10-CM | POA: Diagnosis not present

## 2019-12-22 DIAGNOSIS — R239 Unspecified skin changes: Secondary | ICD-10-CM | POA: Diagnosis not present

## 2019-12-22 DIAGNOSIS — F1190 Opioid use, unspecified, uncomplicated: Secondary | ICD-10-CM | POA: Diagnosis not present

## 2019-12-30 DIAGNOSIS — M7581 Other shoulder lesions, right shoulder: Secondary | ICD-10-CM | POA: Diagnosis not present

## 2019-12-30 DIAGNOSIS — M5412 Radiculopathy, cervical region: Secondary | ICD-10-CM | POA: Diagnosis not present

## 2020-01-11 DIAGNOSIS — L820 Inflamed seborrheic keratosis: Secondary | ICD-10-CM | POA: Diagnosis not present

## 2020-01-11 DIAGNOSIS — L821 Other seborrheic keratosis: Secondary | ICD-10-CM | POA: Diagnosis not present

## 2020-01-11 DIAGNOSIS — L578 Other skin changes due to chronic exposure to nonionizing radiation: Secondary | ICD-10-CM | POA: Diagnosis not present

## 2020-02-17 DIAGNOSIS — G8929 Other chronic pain: Secondary | ICD-10-CM | POA: Diagnosis not present

## 2020-02-17 DIAGNOSIS — I10 Essential (primary) hypertension: Secondary | ICD-10-CM | POA: Diagnosis not present

## 2020-02-17 DIAGNOSIS — R239 Unspecified skin changes: Secondary | ICD-10-CM | POA: Diagnosis not present

## 2020-02-17 DIAGNOSIS — Z79891 Long term (current) use of opiate analgesic: Secondary | ICD-10-CM | POA: Diagnosis not present

## 2020-02-17 DIAGNOSIS — M5417 Radiculopathy, lumbosacral region: Secondary | ICD-10-CM | POA: Diagnosis not present

## 2020-02-17 DIAGNOSIS — Z5181 Encounter for therapeutic drug level monitoring: Secondary | ICD-10-CM | POA: Diagnosis not present

## 2020-02-17 DIAGNOSIS — Z760 Encounter for issue of repeat prescription: Secondary | ICD-10-CM | POA: Diagnosis not present

## 2020-02-17 DIAGNOSIS — I83812 Varicose veins of left lower extremities with pain: Secondary | ICD-10-CM | POA: Diagnosis not present

## 2020-02-17 DIAGNOSIS — M25552 Pain in left hip: Secondary | ICD-10-CM | POA: Diagnosis not present

## 2020-02-17 DIAGNOSIS — F1190 Opioid use, unspecified, uncomplicated: Secondary | ICD-10-CM | POA: Diagnosis not present

## 2020-03-16 DIAGNOSIS — Z03818 Encounter for observation for suspected exposure to other biological agents ruled out: Secondary | ICD-10-CM | POA: Diagnosis not present

## 2020-04-19 DIAGNOSIS — G5601 Carpal tunnel syndrome, right upper limb: Secondary | ICD-10-CM | POA: Diagnosis not present

## 2020-04-19 DIAGNOSIS — G5621 Lesion of ulnar nerve, right upper limb: Secondary | ICD-10-CM | POA: Diagnosis not present

## 2020-04-19 DIAGNOSIS — M65321 Trigger finger, right index finger: Secondary | ICD-10-CM | POA: Diagnosis not present

## 2020-04-21 DIAGNOSIS — Z760 Encounter for issue of repeat prescription: Secondary | ICD-10-CM | POA: Diagnosis not present

## 2020-04-21 DIAGNOSIS — M5417 Radiculopathy, lumbosacral region: Secondary | ICD-10-CM | POA: Diagnosis not present

## 2020-04-21 DIAGNOSIS — I83812 Varicose veins of left lower extremities with pain: Secondary | ICD-10-CM | POA: Diagnosis not present

## 2020-04-21 DIAGNOSIS — M25552 Pain in left hip: Secondary | ICD-10-CM | POA: Diagnosis not present

## 2020-04-21 DIAGNOSIS — R239 Unspecified skin changes: Secondary | ICD-10-CM | POA: Diagnosis not present

## 2020-04-21 DIAGNOSIS — Z79891 Long term (current) use of opiate analgesic: Secondary | ICD-10-CM | POA: Diagnosis not present

## 2020-04-21 DIAGNOSIS — I10 Essential (primary) hypertension: Secondary | ICD-10-CM | POA: Diagnosis not present

## 2020-04-21 DIAGNOSIS — G8929 Other chronic pain: Secondary | ICD-10-CM | POA: Diagnosis not present

## 2020-04-21 DIAGNOSIS — F1190 Opioid use, unspecified, uncomplicated: Secondary | ICD-10-CM | POA: Diagnosis not present

## 2020-04-21 DIAGNOSIS — Z5181 Encounter for therapeutic drug level monitoring: Secondary | ICD-10-CM | POA: Diagnosis not present

## 2020-04-28 DIAGNOSIS — Z01818 Encounter for other preprocedural examination: Secondary | ICD-10-CM | POA: Diagnosis not present

## 2020-04-28 DIAGNOSIS — Z0000 Encounter for general adult medical examination without abnormal findings: Secondary | ICD-10-CM | POA: Diagnosis not present

## 2020-04-28 DIAGNOSIS — Z130 Encounter for screening for diseases of the blood and blood-forming organs and certain disorders involving the immune mechanism: Secondary | ICD-10-CM | POA: Diagnosis not present

## 2020-04-28 DIAGNOSIS — G5600 Carpal tunnel syndrome, unspecified upper limb: Secondary | ICD-10-CM | POA: Diagnosis not present

## 2020-04-28 DIAGNOSIS — I10 Essential (primary) hypertension: Secondary | ICD-10-CM | POA: Diagnosis not present

## 2020-05-18 DIAGNOSIS — I10 Essential (primary) hypertension: Secondary | ICD-10-CM | POA: Diagnosis not present

## 2020-05-18 DIAGNOSIS — F1190 Opioid use, unspecified, uncomplicated: Secondary | ICD-10-CM | POA: Diagnosis not present

## 2020-05-18 DIAGNOSIS — R300 Dysuria: Secondary | ICD-10-CM | POA: Diagnosis not present

## 2020-05-18 DIAGNOSIS — R109 Unspecified abdominal pain: Secondary | ICD-10-CM | POA: Diagnosis not present

## 2020-05-18 DIAGNOSIS — Z79891 Long term (current) use of opiate analgesic: Secondary | ICD-10-CM | POA: Diagnosis not present

## 2020-05-18 DIAGNOSIS — M5417 Radiculopathy, lumbosacral region: Secondary | ICD-10-CM | POA: Diagnosis not present

## 2020-05-18 DIAGNOSIS — Z760 Encounter for issue of repeat prescription: Secondary | ICD-10-CM | POA: Diagnosis not present

## 2020-05-18 DIAGNOSIS — G8929 Other chronic pain: Secondary | ICD-10-CM | POA: Diagnosis not present

## 2020-05-18 DIAGNOSIS — M25552 Pain in left hip: Secondary | ICD-10-CM | POA: Diagnosis not present

## 2020-05-18 DIAGNOSIS — Z5181 Encounter for therapeutic drug level monitoring: Secondary | ICD-10-CM | POA: Diagnosis not present

## 2020-05-20 DIAGNOSIS — Z789 Other specified health status: Secondary | ICD-10-CM | POA: Diagnosis not present

## 2020-05-20 DIAGNOSIS — I2510 Atherosclerotic heart disease of native coronary artery without angina pectoris: Secondary | ICD-10-CM | POA: Diagnosis not present

## 2020-05-20 DIAGNOSIS — Z01810 Encounter for preprocedural cardiovascular examination: Secondary | ICD-10-CM | POA: Diagnosis not present

## 2020-05-20 DIAGNOSIS — R001 Bradycardia, unspecified: Secondary | ICD-10-CM | POA: Diagnosis not present

## 2020-05-20 DIAGNOSIS — R609 Edema, unspecified: Secondary | ICD-10-CM | POA: Diagnosis not present

## 2020-05-20 DIAGNOSIS — R9431 Abnormal electrocardiogram [ECG] [EKG]: Secondary | ICD-10-CM | POA: Diagnosis not present

## 2020-06-14 DIAGNOSIS — R9431 Abnormal electrocardiogram [ECG] [EKG]: Secondary | ICD-10-CM | POA: Diagnosis not present

## 2020-06-14 DIAGNOSIS — I361 Nonrheumatic tricuspid (valve) insufficiency: Secondary | ICD-10-CM | POA: Diagnosis not present

## 2020-06-14 DIAGNOSIS — I371 Nonrheumatic pulmonary valve insufficiency: Secondary | ICD-10-CM | POA: Diagnosis not present

## 2020-06-14 DIAGNOSIS — I2510 Atherosclerotic heart disease of native coronary artery without angina pectoris: Secondary | ICD-10-CM | POA: Diagnosis not present

## 2020-06-14 DIAGNOSIS — R609 Edema, unspecified: Secondary | ICD-10-CM | POA: Diagnosis not present

## 2020-06-14 DIAGNOSIS — I517 Cardiomegaly: Secondary | ICD-10-CM | POA: Diagnosis not present

## 2020-06-14 DIAGNOSIS — Z789 Other specified health status: Secondary | ICD-10-CM | POA: Diagnosis not present

## 2020-06-14 DIAGNOSIS — R001 Bradycardia, unspecified: Secondary | ICD-10-CM | POA: Diagnosis not present

## 2020-06-14 DIAGNOSIS — Z01810 Encounter for preprocedural cardiovascular examination: Secondary | ICD-10-CM | POA: Diagnosis not present

## 2020-06-15 DIAGNOSIS — R001 Bradycardia, unspecified: Secondary | ICD-10-CM | POA: Diagnosis not present

## 2020-06-17 DIAGNOSIS — R109 Unspecified abdominal pain: Secondary | ICD-10-CM | POA: Diagnosis not present

## 2020-06-17 DIAGNOSIS — R300 Dysuria: Secondary | ICD-10-CM | POA: Diagnosis not present

## 2020-06-17 DIAGNOSIS — M549 Dorsalgia, unspecified: Secondary | ICD-10-CM | POA: Diagnosis not present

## 2020-06-17 DIAGNOSIS — F1190 Opioid use, unspecified, uncomplicated: Secondary | ICD-10-CM | POA: Diagnosis not present

## 2020-06-17 DIAGNOSIS — R1031 Right lower quadrant pain: Secondary | ICD-10-CM | POA: Diagnosis not present

## 2020-06-17 DIAGNOSIS — N200 Calculus of kidney: Secondary | ICD-10-CM | POA: Diagnosis not present

## 2020-06-17 DIAGNOSIS — Z79891 Long term (current) use of opiate analgesic: Secondary | ICD-10-CM | POA: Diagnosis not present

## 2020-06-17 DIAGNOSIS — M5417 Radiculopathy, lumbosacral region: Secondary | ICD-10-CM | POA: Diagnosis not present

## 2020-06-17 DIAGNOSIS — I10 Essential (primary) hypertension: Secondary | ICD-10-CM | POA: Diagnosis not present

## 2020-06-17 DIAGNOSIS — Z5181 Encounter for therapeutic drug level monitoring: Secondary | ICD-10-CM | POA: Diagnosis not present

## 2020-06-17 DIAGNOSIS — G8929 Other chronic pain: Secondary | ICD-10-CM | POA: Diagnosis not present

## 2020-07-01 ENCOUNTER — Other Ambulatory Visit: Payer: Self-pay | Admitting: Nurse Practitioner

## 2020-07-01 DIAGNOSIS — M545 Low back pain, unspecified: Secondary | ICD-10-CM

## 2020-07-01 DIAGNOSIS — M546 Pain in thoracic spine: Secondary | ICD-10-CM

## 2020-07-15 DIAGNOSIS — M25552 Pain in left hip: Secondary | ICD-10-CM | POA: Diagnosis not present

## 2020-07-15 DIAGNOSIS — M5417 Radiculopathy, lumbosacral region: Secondary | ICD-10-CM | POA: Diagnosis not present

## 2020-07-15 DIAGNOSIS — Z5181 Encounter for therapeutic drug level monitoring: Secondary | ICD-10-CM | POA: Diagnosis not present

## 2020-07-15 DIAGNOSIS — Z79891 Long term (current) use of opiate analgesic: Secondary | ICD-10-CM | POA: Diagnosis not present

## 2020-07-15 DIAGNOSIS — F1190 Opioid use, unspecified, uncomplicated: Secondary | ICD-10-CM | POA: Diagnosis not present

## 2020-07-15 DIAGNOSIS — Z760 Encounter for issue of repeat prescription: Secondary | ICD-10-CM | POA: Diagnosis not present

## 2020-07-15 DIAGNOSIS — G8929 Other chronic pain: Secondary | ICD-10-CM | POA: Diagnosis not present

## 2020-07-15 DIAGNOSIS — Z79899 Other long term (current) drug therapy: Secondary | ICD-10-CM | POA: Diagnosis not present

## 2020-07-15 DIAGNOSIS — I10 Essential (primary) hypertension: Secondary | ICD-10-CM | POA: Diagnosis not present

## 2020-08-17 DIAGNOSIS — Z79899 Other long term (current) drug therapy: Secondary | ICD-10-CM | POA: Diagnosis not present

## 2020-08-17 DIAGNOSIS — M25552 Pain in left hip: Secondary | ICD-10-CM | POA: Diagnosis not present

## 2020-08-17 DIAGNOSIS — Z79891 Long term (current) use of opiate analgesic: Secondary | ICD-10-CM | POA: Diagnosis not present

## 2020-08-17 DIAGNOSIS — M5417 Radiculopathy, lumbosacral region: Secondary | ICD-10-CM | POA: Diagnosis not present

## 2020-08-17 DIAGNOSIS — F1190 Opioid use, unspecified, uncomplicated: Secondary | ICD-10-CM | POA: Diagnosis not present

## 2020-08-17 DIAGNOSIS — G4700 Insomnia, unspecified: Secondary | ICD-10-CM | POA: Diagnosis not present

## 2020-08-17 DIAGNOSIS — Z5181 Encounter for therapeutic drug level monitoring: Secondary | ICD-10-CM | POA: Diagnosis not present

## 2020-08-17 DIAGNOSIS — E039 Hypothyroidism, unspecified: Secondary | ICD-10-CM | POA: Diagnosis not present

## 2020-08-17 DIAGNOSIS — Z760 Encounter for issue of repeat prescription: Secondary | ICD-10-CM | POA: Diagnosis not present

## 2020-08-17 DIAGNOSIS — I10 Essential (primary) hypertension: Secondary | ICD-10-CM | POA: Diagnosis not present

## 2020-08-17 DIAGNOSIS — G8929 Other chronic pain: Secondary | ICD-10-CM | POA: Diagnosis not present

## 2020-08-30 DIAGNOSIS — E662 Morbid (severe) obesity with alveolar hypoventilation: Secondary | ICD-10-CM | POA: Diagnosis not present

## 2020-08-30 DIAGNOSIS — R5383 Other fatigue: Secondary | ICD-10-CM | POA: Diagnosis not present

## 2020-08-30 DIAGNOSIS — G4733 Obstructive sleep apnea (adult) (pediatric): Secondary | ICD-10-CM | POA: Diagnosis not present

## 2020-08-30 DIAGNOSIS — R0902 Hypoxemia: Secondary | ICD-10-CM | POA: Diagnosis not present

## 2020-09-09 DIAGNOSIS — G5621 Lesion of ulnar nerve, right upper limb: Secondary | ICD-10-CM | POA: Diagnosis not present

## 2020-09-09 DIAGNOSIS — Z9889 Other specified postprocedural states: Secondary | ICD-10-CM | POA: Diagnosis not present

## 2020-09-09 DIAGNOSIS — G5601 Carpal tunnel syndrome, right upper limb: Secondary | ICD-10-CM | POA: Diagnosis not present

## 2020-09-16 DIAGNOSIS — G8929 Other chronic pain: Secondary | ICD-10-CM | POA: Diagnosis not present

## 2020-09-16 DIAGNOSIS — F1190 Opioid use, unspecified, uncomplicated: Secondary | ICD-10-CM | POA: Diagnosis not present

## 2020-09-16 DIAGNOSIS — Z79891 Long term (current) use of opiate analgesic: Secondary | ICD-10-CM | POA: Diagnosis not present

## 2020-09-16 DIAGNOSIS — Z5181 Encounter for therapeutic drug level monitoring: Secondary | ICD-10-CM | POA: Diagnosis not present

## 2020-09-16 DIAGNOSIS — I10 Essential (primary) hypertension: Secondary | ICD-10-CM | POA: Diagnosis not present

## 2020-09-16 DIAGNOSIS — M25552 Pain in left hip: Secondary | ICD-10-CM | POA: Diagnosis not present

## 2020-09-16 DIAGNOSIS — M5417 Radiculopathy, lumbosacral region: Secondary | ICD-10-CM | POA: Diagnosis not present

## 2020-09-16 DIAGNOSIS — Z760 Encounter for issue of repeat prescription: Secondary | ICD-10-CM | POA: Diagnosis not present

## 2020-09-28 DIAGNOSIS — G5621 Lesion of ulnar nerve, right upper limb: Secondary | ICD-10-CM | POA: Diagnosis not present

## 2020-09-28 DIAGNOSIS — G5601 Carpal tunnel syndrome, right upper limb: Secondary | ICD-10-CM | POA: Diagnosis not present

## 2020-09-28 DIAGNOSIS — I10 Essential (primary) hypertension: Secondary | ICD-10-CM | POA: Diagnosis not present

## 2020-09-28 DIAGNOSIS — Z87891 Personal history of nicotine dependence: Secondary | ICD-10-CM | POA: Diagnosis not present

## 2020-09-28 DIAGNOSIS — Z7982 Long term (current) use of aspirin: Secondary | ICD-10-CM | POA: Diagnosis not present

## 2020-10-13 DIAGNOSIS — Z760 Encounter for issue of repeat prescription: Secondary | ICD-10-CM | POA: Diagnosis not present

## 2020-10-13 DIAGNOSIS — Z79891 Long term (current) use of opiate analgesic: Secondary | ICD-10-CM | POA: Diagnosis not present

## 2020-10-13 DIAGNOSIS — M5417 Radiculopathy, lumbosacral region: Secondary | ICD-10-CM | POA: Diagnosis not present

## 2020-10-13 DIAGNOSIS — F1190 Opioid use, unspecified, uncomplicated: Secondary | ICD-10-CM | POA: Diagnosis not present

## 2020-10-13 DIAGNOSIS — I10 Essential (primary) hypertension: Secondary | ICD-10-CM | POA: Diagnosis not present

## 2020-10-13 DIAGNOSIS — Z5181 Encounter for therapeutic drug level monitoring: Secondary | ICD-10-CM | POA: Diagnosis not present

## 2020-10-13 DIAGNOSIS — G8929 Other chronic pain: Secondary | ICD-10-CM | POA: Diagnosis not present

## 2020-10-13 DIAGNOSIS — M25552 Pain in left hip: Secondary | ICD-10-CM | POA: Diagnosis not present

## 2020-11-16 DIAGNOSIS — Z0000 Encounter for general adult medical examination without abnormal findings: Secondary | ICD-10-CM | POA: Diagnosis not present

## 2020-11-16 DIAGNOSIS — M25552 Pain in left hip: Secondary | ICD-10-CM | POA: Diagnosis not present

## 2020-11-16 DIAGNOSIS — E119 Type 2 diabetes mellitus without complications: Secondary | ICD-10-CM | POA: Diagnosis not present

## 2020-11-16 DIAGNOSIS — G8929 Other chronic pain: Secondary | ICD-10-CM | POA: Diagnosis not present

## 2020-11-16 DIAGNOSIS — F1190 Opioid use, unspecified, uncomplicated: Secondary | ICD-10-CM | POA: Diagnosis not present

## 2020-11-16 DIAGNOSIS — Z760 Encounter for issue of repeat prescription: Secondary | ICD-10-CM | POA: Diagnosis not present

## 2020-11-16 DIAGNOSIS — Z79891 Long term (current) use of opiate analgesic: Secondary | ICD-10-CM | POA: Diagnosis not present

## 2020-11-16 DIAGNOSIS — M5417 Radiculopathy, lumbosacral region: Secondary | ICD-10-CM | POA: Diagnosis not present

## 2020-11-16 DIAGNOSIS — Z79899 Other long term (current) drug therapy: Secondary | ICD-10-CM | POA: Diagnosis not present

## 2020-11-16 DIAGNOSIS — Z5181 Encounter for therapeutic drug level monitoring: Secondary | ICD-10-CM | POA: Diagnosis not present

## 2020-11-16 DIAGNOSIS — Z130 Encounter for screening for diseases of the blood and blood-forming organs and certain disorders involving the immune mechanism: Secondary | ICD-10-CM | POA: Diagnosis not present

## 2020-11-16 DIAGNOSIS — E039 Hypothyroidism, unspecified: Secondary | ICD-10-CM | POA: Diagnosis not present

## 2020-11-16 DIAGNOSIS — I10 Essential (primary) hypertension: Secondary | ICD-10-CM | POA: Diagnosis not present

## 2020-11-16 DIAGNOSIS — E785 Hyperlipidemia, unspecified: Secondary | ICD-10-CM | POA: Diagnosis not present

## 2020-11-16 DIAGNOSIS — M545 Low back pain, unspecified: Secondary | ICD-10-CM | POA: Diagnosis not present

## 2020-11-29 DIAGNOSIS — G4733 Obstructive sleep apnea (adult) (pediatric): Secondary | ICD-10-CM | POA: Diagnosis not present

## 2020-12-06 DIAGNOSIS — G4761 Periodic limb movement disorder: Secondary | ICD-10-CM | POA: Diagnosis not present

## 2020-12-06 DIAGNOSIS — R5383 Other fatigue: Secondary | ICD-10-CM | POA: Diagnosis not present

## 2020-12-06 DIAGNOSIS — E662 Morbid (severe) obesity with alveolar hypoventilation: Secondary | ICD-10-CM | POA: Diagnosis not present

## 2020-12-06 DIAGNOSIS — R0902 Hypoxemia: Secondary | ICD-10-CM | POA: Diagnosis not present

## 2020-12-15 DIAGNOSIS — M5417 Radiculopathy, lumbosacral region: Secondary | ICD-10-CM | POA: Diagnosis not present

## 2020-12-15 DIAGNOSIS — G8929 Other chronic pain: Secondary | ICD-10-CM | POA: Diagnosis not present

## 2020-12-15 DIAGNOSIS — Z79891 Long term (current) use of opiate analgesic: Secondary | ICD-10-CM | POA: Diagnosis not present

## 2020-12-15 DIAGNOSIS — M25552 Pain in left hip: Secondary | ICD-10-CM | POA: Diagnosis not present

## 2020-12-15 DIAGNOSIS — G2581 Restless legs syndrome: Secondary | ICD-10-CM | POA: Diagnosis not present

## 2020-12-15 DIAGNOSIS — Z760 Encounter for issue of repeat prescription: Secondary | ICD-10-CM | POA: Diagnosis not present

## 2020-12-15 DIAGNOSIS — I10 Essential (primary) hypertension: Secondary | ICD-10-CM | POA: Diagnosis not present

## 2020-12-15 DIAGNOSIS — F1190 Opioid use, unspecified, uncomplicated: Secondary | ICD-10-CM | POA: Diagnosis not present

## 2020-12-15 DIAGNOSIS — Z5181 Encounter for therapeutic drug level monitoring: Secondary | ICD-10-CM | POA: Diagnosis not present

## 2020-12-15 DIAGNOSIS — M545 Low back pain, unspecified: Secondary | ICD-10-CM | POA: Diagnosis not present

## 2021-01-13 DIAGNOSIS — M25552 Pain in left hip: Secondary | ICD-10-CM | POA: Diagnosis not present

## 2021-01-13 DIAGNOSIS — Z5181 Encounter for therapeutic drug level monitoring: Secondary | ICD-10-CM | POA: Diagnosis not present

## 2021-01-13 DIAGNOSIS — Z760 Encounter for issue of repeat prescription: Secondary | ICD-10-CM | POA: Diagnosis not present

## 2021-01-13 DIAGNOSIS — G2581 Restless legs syndrome: Secondary | ICD-10-CM | POA: Diagnosis not present

## 2021-01-13 DIAGNOSIS — Z79891 Long term (current) use of opiate analgesic: Secondary | ICD-10-CM | POA: Diagnosis not present

## 2021-01-13 DIAGNOSIS — M545 Low back pain, unspecified: Secondary | ICD-10-CM | POA: Diagnosis not present

## 2021-01-13 DIAGNOSIS — M5417 Radiculopathy, lumbosacral region: Secondary | ICD-10-CM | POA: Diagnosis not present

## 2021-01-13 DIAGNOSIS — I10 Essential (primary) hypertension: Secondary | ICD-10-CM | POA: Diagnosis not present

## 2021-01-13 DIAGNOSIS — G8929 Other chronic pain: Secondary | ICD-10-CM | POA: Diagnosis not present

## 2021-01-13 DIAGNOSIS — F1190 Opioid use, unspecified, uncomplicated: Secondary | ICD-10-CM | POA: Diagnosis not present

## 2021-01-26 DIAGNOSIS — R0600 Dyspnea, unspecified: Secondary | ICD-10-CM | POA: Diagnosis not present

## 2021-01-26 DIAGNOSIS — R051 Acute cough: Secondary | ICD-10-CM | POA: Diagnosis not present

## 2021-01-26 DIAGNOSIS — J209 Acute bronchitis, unspecified: Secondary | ICD-10-CM | POA: Diagnosis not present

## 2021-01-26 DIAGNOSIS — R519 Headache, unspecified: Secondary | ICD-10-CM | POA: Diagnosis not present

## 2021-02-10 DIAGNOSIS — M545 Low back pain, unspecified: Secondary | ICD-10-CM | POA: Diagnosis not present

## 2021-02-10 DIAGNOSIS — F1190 Opioid use, unspecified, uncomplicated: Secondary | ICD-10-CM | POA: Diagnosis not present

## 2021-02-10 DIAGNOSIS — Z03818 Encounter for observation for suspected exposure to other biological agents ruled out: Secondary | ICD-10-CM | POA: Diagnosis not present

## 2021-02-10 DIAGNOSIS — R519 Headache, unspecified: Secondary | ICD-10-CM | POA: Diagnosis not present

## 2021-02-10 DIAGNOSIS — Z5181 Encounter for therapeutic drug level monitoring: Secondary | ICD-10-CM | POA: Diagnosis not present

## 2021-02-10 DIAGNOSIS — M25552 Pain in left hip: Secondary | ICD-10-CM | POA: Diagnosis not present

## 2021-02-10 DIAGNOSIS — R059 Cough, unspecified: Secondary | ICD-10-CM | POA: Diagnosis not present

## 2021-02-10 DIAGNOSIS — G2581 Restless legs syndrome: Secondary | ICD-10-CM | POA: Diagnosis not present

## 2021-02-10 DIAGNOSIS — Z79891 Long term (current) use of opiate analgesic: Secondary | ICD-10-CM | POA: Diagnosis not present

## 2021-02-10 DIAGNOSIS — I10 Essential (primary) hypertension: Secondary | ICD-10-CM | POA: Diagnosis not present

## 2021-02-10 DIAGNOSIS — G8929 Other chronic pain: Secondary | ICD-10-CM | POA: Diagnosis not present

## 2021-02-10 DIAGNOSIS — R0981 Nasal congestion: Secondary | ICD-10-CM | POA: Diagnosis not present

## 2021-02-10 DIAGNOSIS — M5417 Radiculopathy, lumbosacral region: Secondary | ICD-10-CM | POA: Diagnosis not present

## 2021-02-27 DIAGNOSIS — I10 Essential (primary) hypertension: Secondary | ICD-10-CM | POA: Diagnosis not present

## 2021-02-27 DIAGNOSIS — R059 Cough, unspecified: Secondary | ICD-10-CM | POA: Diagnosis not present

## 2021-02-27 DIAGNOSIS — E782 Mixed hyperlipidemia: Secondary | ICD-10-CM | POA: Diagnosis not present

## 2021-02-27 DIAGNOSIS — E039 Hypothyroidism, unspecified: Secondary | ICD-10-CM | POA: Diagnosis not present

## 2021-02-27 DIAGNOSIS — R0981 Nasal congestion: Secondary | ICD-10-CM | POA: Diagnosis not present

## 2021-02-27 DIAGNOSIS — M940 Chondrocostal junction syndrome [Tietze]: Secondary | ICD-10-CM | POA: Diagnosis not present

## 2021-02-27 DIAGNOSIS — R0789 Other chest pain: Secondary | ICD-10-CM | POA: Diagnosis not present

## 2021-02-27 DIAGNOSIS — Z9114 Patient's other noncompliance with medication regimen: Secondary | ICD-10-CM | POA: Diagnosis not present

## 2021-04-29 IMAGING — MR MR SHOULDER*R* W/O CM
5 series · 35 of 40 positions shown · non-contrast
Comparison: MRI right shoulder dated January 24, 2017.

CLINICAL DATA: Chronic right shoulder pain for the past 2 years.
Prior rotator cuff repair.

EXAM:
MRI OF THE RIGHT SHOULDER WITHOUT CONTRAST
TECHNIQUE: Multiplanar, multisequence MR imaging of the shoulder was performed.
No intravenous contrast was administered.

[Series 4: T2 fat-sat · oblique · 4.0mm · 0.55mm/px · 7 of 16 slices shown (1 of 2)]
[im 1/16]
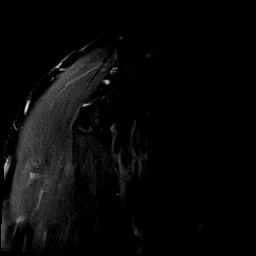
[im 3/16]
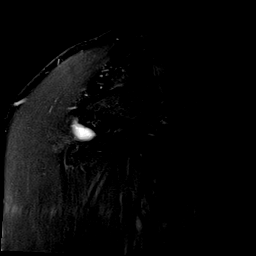
[im 6/16]
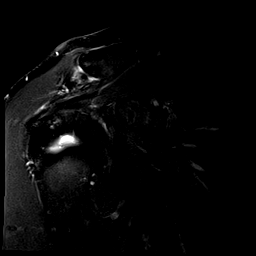
[im 8/16]
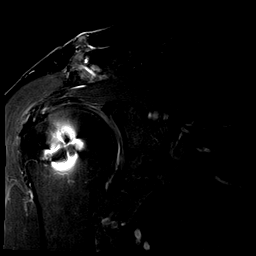
[im 11/16]
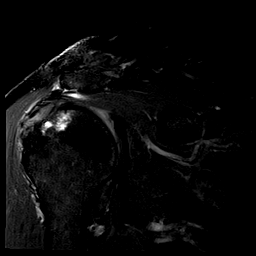
[im 13/16]
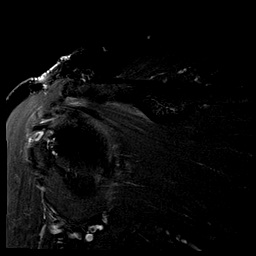
[im 16/16]
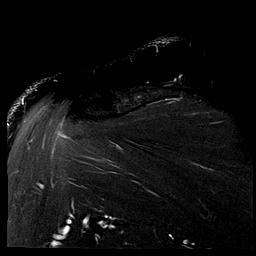

[Series 5: PD fat-sat · oblique · 4.0mm · 0.27mm/px · 8 of 18 slices shown (1 of 2)]
[im 1/18]
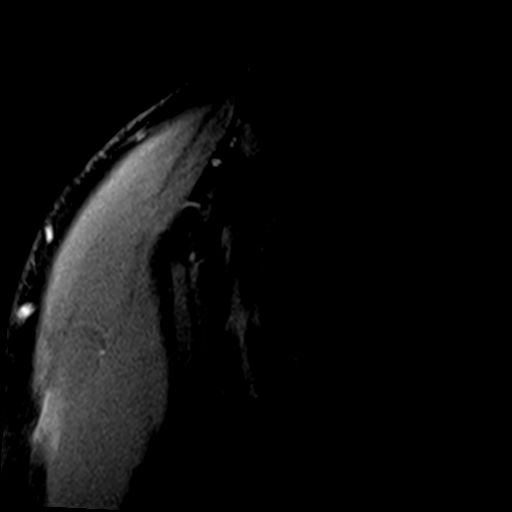
[im 3/18]
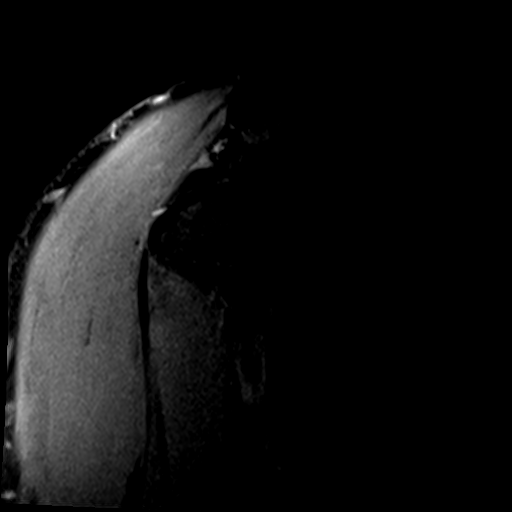
[im 5/18]
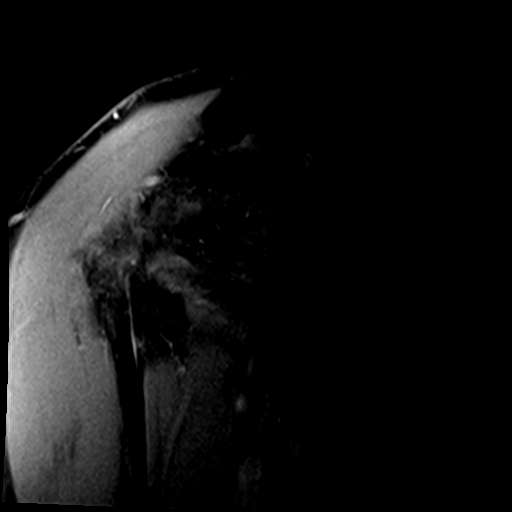
[im 8/18]
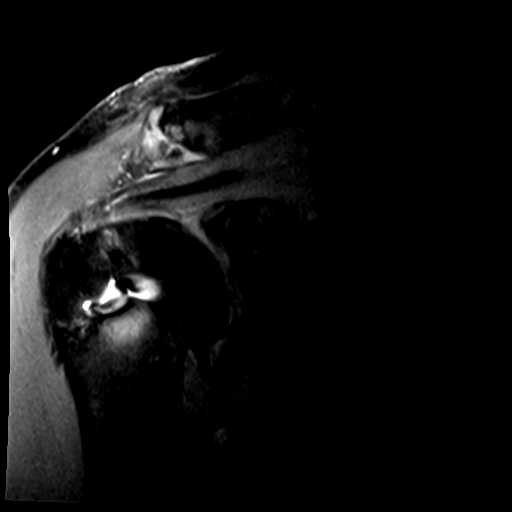
[im 10/18]
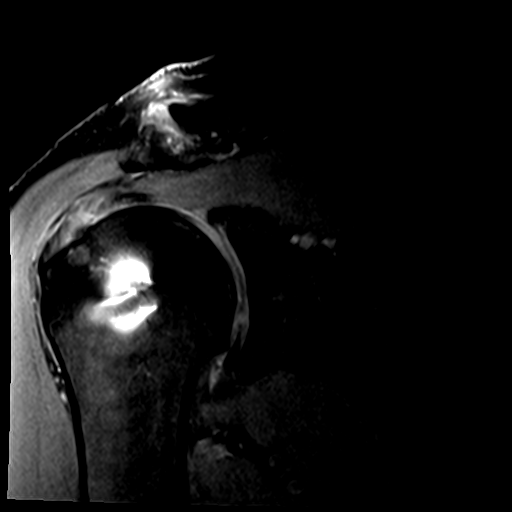
[im 13/18]
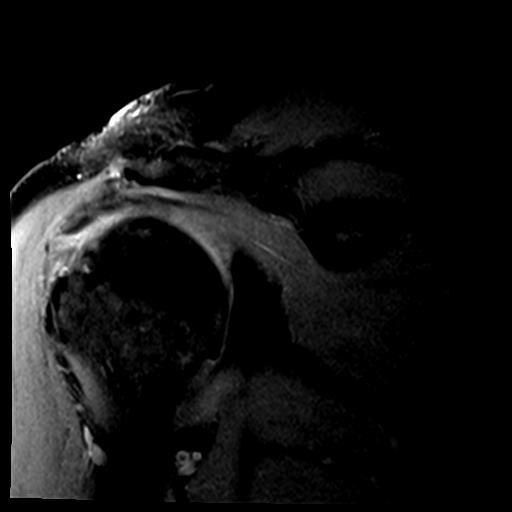
[im 15/18]
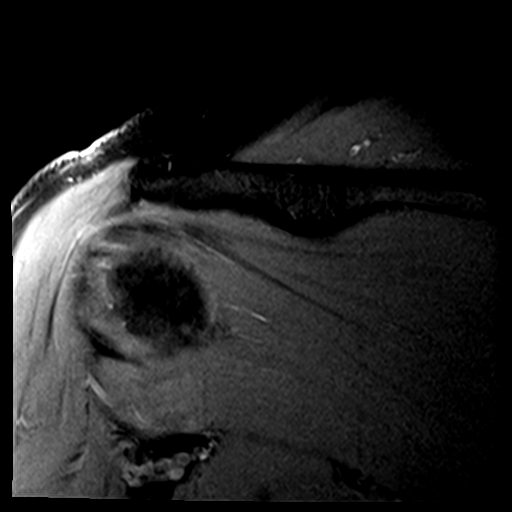
[im 18/18]
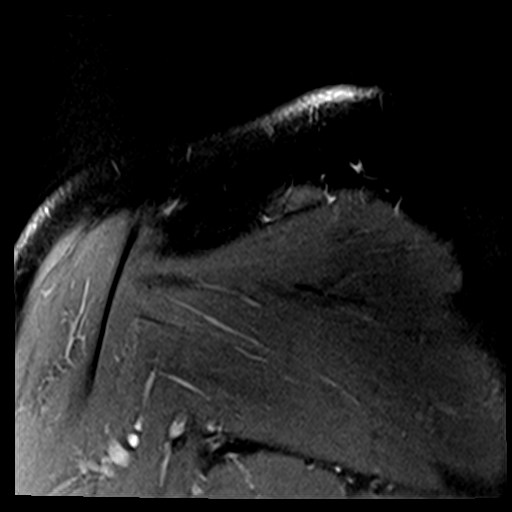

[Series 6: T1 · oblique · 4.0mm · 0.27mm/px · 3 of 18 slices shown]
[im 1/18]
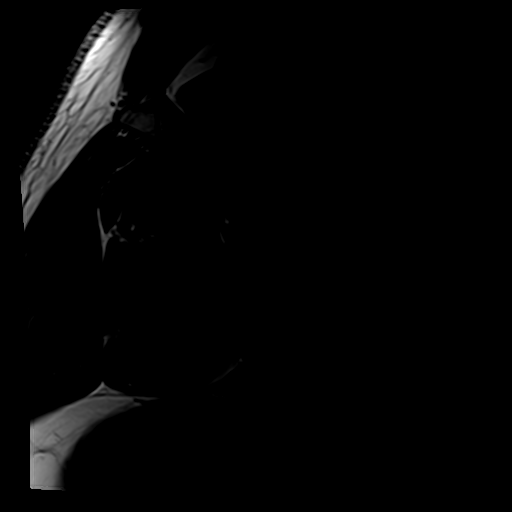
[im 3/18]
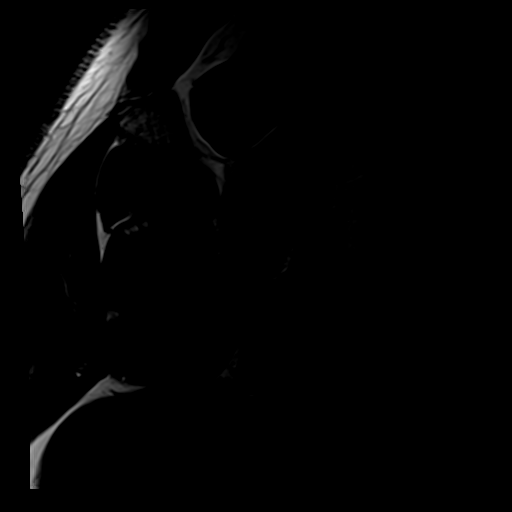
[im 5/18]
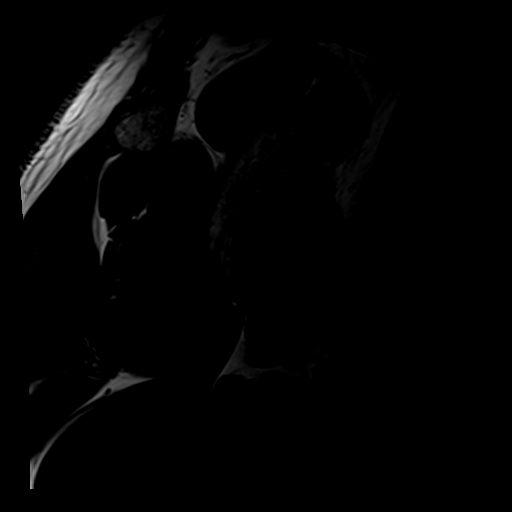

[Series 7: T2 fat-sat · oblique · 4.0mm · 0.55mm/px · 8 of 18 slices shown (2 of 2)]
[im 1/18]
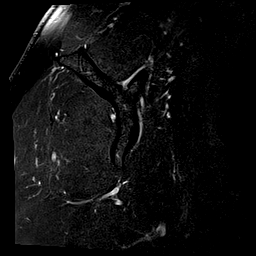
[im 3/18]
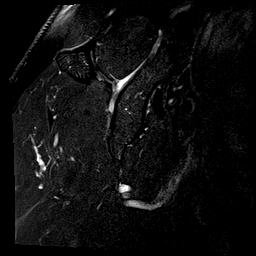
[im 5/18]
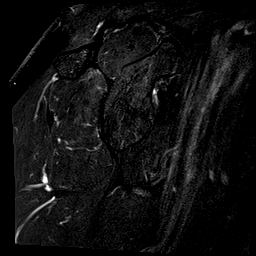
[im 8/18]
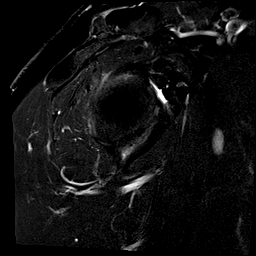
[im 10/18]
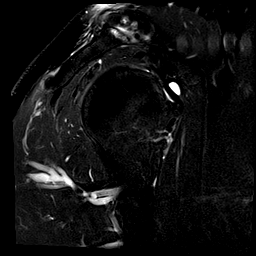
[im 13/18]
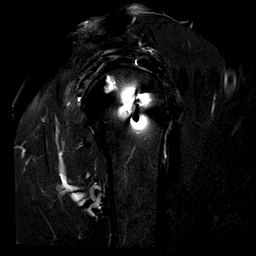
[im 15/18]
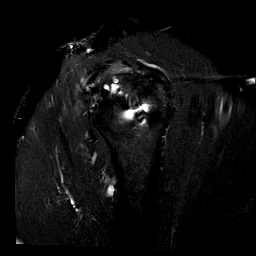
[im 18/18]
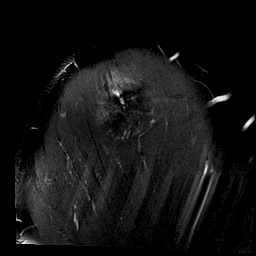

[Series 8: PD fat-sat · axial · 4.0mm · 0.55mm/px · z∈[+8,+97]mm · 9 of 20 slices shown (2 of 2)]
[im 1/20]
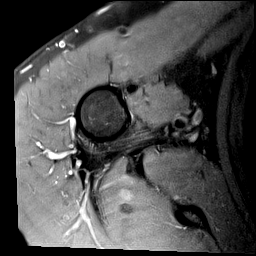
[im 3/20]
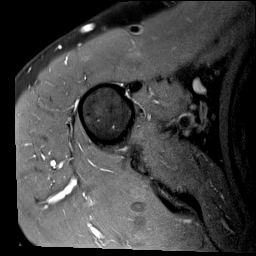
[im 5/20]
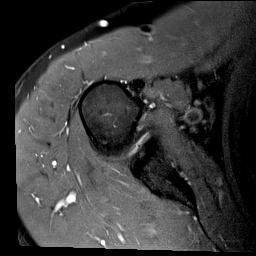
[im 8/20]
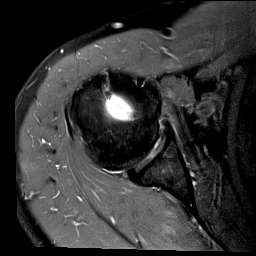
[im 10/20]
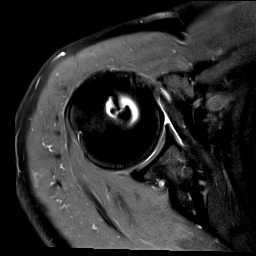
[im 12/20]
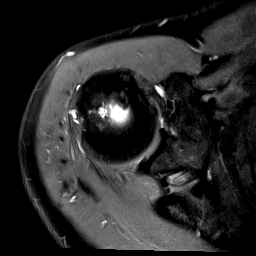
[im 15/20]
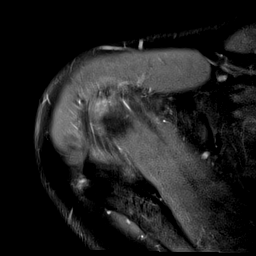
[im 17/20]
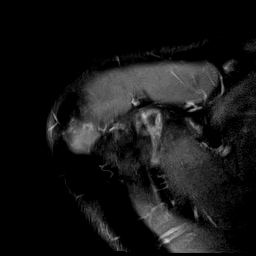
[im 20/20]
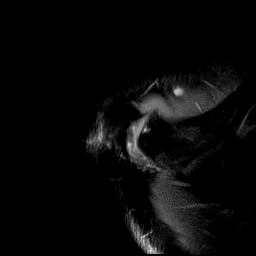

[35 of 40 positions shown; findings below may reference images not displayed]

FINDINGS: Rotator cuff: Interval supraspinatus tendon repair. Mild
supraspinatus tendinosis with several tiny intrasubstance tears at
the insertion but no recurrent high-grade tear. Continued severe
infraspinatus tendinosis with new small intrasubstance tear. Mild
subscapularis tendinosis. The teres minor tendon is unremarkable.

Muscles: No atrophy or abnormal signal of the muscles of the rotator
cuff.

Biceps long head:  Interval tenotomy.

Acromioclavicular Joint: Moderate arthropathy of the
acromioclavicular joint. Type II acromion. Trace
subacromial/subdeltoid bursal fluid.

Glenohumeral Joint: Trace joint effusion. No chondral defect.

Labrum: Grossly intact, but evaluation is limited by lack of
intraarticular fluid. Degenerated superior labrum.

Bones: No acute fracture or dislocation. No acute osseous
abnormality.

Other: None.
IMPRESSION: 1. Interval supraspinatus tendon repair. Mild supraspinatus
tendinosis with several tiny intrasubstance tears at the insertion
but no recurrent high-grade tear.
2. Continued severe infraspinatus tendinosis with new small
intrasubstance tear.
3. Interval biceps tenotomy.
4. Moderate acromioclavicular osteoarthritis.

## 2021-11-24 DIAGNOSIS — Z01818 Encounter for other preprocedural examination: Secondary | ICD-10-CM | POA: Diagnosis not present

## 2022-01-02 ENCOUNTER — Other Ambulatory Visit: Payer: Self-pay | Admitting: *Deleted

## 2022-01-02 DIAGNOSIS — R600 Localized edema: Secondary | ICD-10-CM

## 2022-01-09 NOTE — Progress Notes (Deleted)
Office Note     CC:  follow up Requesting Provider:  Martin Majestic, *  HPI: Juan Young is a 71 y.o. (1950-05-05) male who presents ***  The pt *** on a statin for cholesterol management.  The pt *** on a daily aspirin.   Other AC:  *** The pt *** on *** for hypertension.   The pt *** diabetic.  *** Tobacco hx:  ***  Past Medical History:  Diagnosis Date   Arthritis    Blood clot in vein    71 years old   Borderline diabetes    Cataract    Headache    Hypercholesteremia    Hypothyroidism    Pneumonia    Varicose vein of leg    Varicose veins of right lower extremity    Vitamin D deficiency    Wears dentures     Past Surgical History:  Procedure Laterality Date   carpel tunnel surgery Left 08/2018   ENDOVENOUS ABLATION SAPHENOUS VEIN W/ LASER Right 09/24/2018   endovenous laser ablation right small saphenous vein by Ruta Hinds MD    MULTIPLE TOOTH EXTRACTIONS     ROTATOR CUFF REPAIR Bilateral    VEIN LIGATION AND STRIPPING Right 11/18/2018   Procedure: VEIN LIGATION AND STRIPPING SMALL SAPHENOUS VEIN RIGHT LEG/STAB PHLEBECTOMY GREATER THAN 20 STABS RIGHT LEG;  Surgeon: Elam Dutch, MD;  Location: [facility] OR;  Service: Vascular;  Laterality: Right;    Social History   Socioeconomic History   Marital status: Widowed    Spouse name: Not on file   Number of children: Not on file   Years of education: Not on file   Highest education level: Not on file  Occupational History   Not on file  Tobacco Use   Smoking status: Former    Types: Cigarettes   Smokeless tobacco: Never   Tobacco comments:    17 YEARS AGO  Vaping Use   Vaping Use: Never used  Substance and Sexual Activity   Alcohol use: Yes    Comment: occasional beer   Drug use: Never   Sexual activity: Not on file  Other Topics Concern   Not on file  Social History Narrative   Not on file   Social Determinants of Health   Financial Resource Strain: Not on file  Food Insecurity:  Not on file  Transportation Needs: Not on file  Physical Activity: Not on file  Stress: Not on file  Social Connections: Not on file  Intimate Partner Violence: Not on file   *** Family History  Problem Relation Age of Onset   Cancer Mother    Cancer Father     Current Outpatient Medications  Medication Sig Dispense Refill   EUTHYROX 25 MCG tablet Take 25 mcg by mouth daily before breakfast.      ibuprofen (ADVIL) 200 MG tablet Take 400-600 mg by mouth every 8 (eight) hours as needed (pain).     oxyCODONE (OXY IR/ROXICODONE) 5 MG immediate release tablet Take 5 mg by mouth 4 (four) times daily.      Vitamin D, Ergocalciferol, (DRISDOL) 1.25 MG (50000 UT) CAPS capsule Take 50,000 Units by mouth every 7 (seven) days. Mondays     No current facility-administered medications for this visit.    Allergies  Allergen Reactions   Aspirin Other (See Comments)    "doesn't feel right"     REVIEW OF SYSTEMS:  *** [X]  denotes positive finding, [ ]  denotes negative finding Cardiac  Comments:  Chest pain or chest pressure:    Shortness of breath upon exertion:    Short of breath when lying flat:    Irregular heart rhythm:        Vascular    Pain in calf, thigh, or hip brought on by ambulation:    Pain in feet at night that wakes you up from your sleep:     Blood clot in your veins:    Leg swelling:         Pulmonary    Oxygen at home:    Productive cough:     Wheezing:         Neurologic    Sudden weakness in arms or legs:     Sudden numbness in arms or legs:     Sudden onset of difficulty speaking or slurred speech:    Temporary loss of vision in one eye:     Problems with dizziness:         Gastrointestinal    Blood in stool:     Vomited blood:         Genitourinary    Burning when urinating:     Blood in urine:        Psychiatric    Major depression:         Hematologic    Bleeding problems:    Problems with blood clotting too easily:        Skin    Rashes  or ulcers:        Constitutional    Fever or chills:      PHYSICAL EXAMINATION:  There were no vitals filed for this visit.  General:  WDWN in NAD; vital signs documented above Gait: Not observed HENT: WNL, normocephalic Pulmonary: normal non-labored breathing , without Rales, rhonchi,  wheezing Cardiac: {Desc; regular/irreg:14544} HR, without  Murmurs {With/Without:20273} carotid bruit*** Abdomen: soft, NT, no masses Skin: {With/Without:20273} rashes Vascular Exam/Pulses:  Right Left  Radial {Exam; arterial pulse strength 0-4:30167} {Exam; arterial pulse strength 0-4:30167}  Ulnar {Exam; arterial pulse strength 0-4:30167} {Exam; arterial pulse strength 0-4:30167}  Femoral {Exam; arterial pulse strength 0-4:30167} {Exam; arterial pulse strength 0-4:30167}  Popliteal {Exam; arterial pulse strength 0-4:30167} {Exam; arterial pulse strength 0-4:30167}  DP {Exam; arterial pulse strength 0-4:30167} {Exam; arterial pulse strength 0-4:30167}  PT {Exam; arterial pulse strength 0-4:30167} {Exam; arterial pulse strength 0-4:30167}   Extremities: {With/Without:20273} ischemic changes, {With/Without:20273} Gangrene , {With/Without:20273} cellulitis; {With/Without:20273} open wounds;  Musculoskeletal: no muscle wasting or atrophy  Neurologic: A&O X 3;  No focal weakness or paresthesias are detected Psychiatric:  The pt has {Desc; normal/abnormal:11317::"Normal"} affect.   Non-Invasive Vascular Imaging:   ***    ASSESSMENT/PLAN:: 71 y.o. male here for follow up for ***   -***   Karoline Caldwell, PA-C [facility] [phone]  Clinic MD:   Scot Dock

## 2022-01-11 ENCOUNTER — Ambulatory Visit: Payer: PPO

## 2022-01-11 ENCOUNTER — Encounter (HOSPITAL_COMMUNITY): Payer: PPO

## 2022-01-22 DIAGNOSIS — I517 Cardiomegaly: Secondary | ICD-10-CM | POA: Diagnosis not present

## 2022-04-11 LAB — EXTERNAL GENERIC LAB PROCEDURE: COLOGUARD: NEGATIVE
